# Patient Record
Sex: Male | Born: 1955 | Race: White | Hispanic: No | Marital: Married | Smoking: Never smoker
Health system: Southern US, Community
[De-identification: ages and names within clinical notes are randomized; demographics above are authoritative.]

## PROBLEM LIST (undated history)

## (undated) DIAGNOSIS — M1990 Unspecified osteoarthritis, unspecified site: Secondary | ICD-10-CM

## (undated) DIAGNOSIS — Z8619 Personal history of other infectious and parasitic diseases: Secondary | ICD-10-CM

## (undated) DIAGNOSIS — Z9889 Other specified postprocedural states: Secondary | ICD-10-CM

## (undated) DIAGNOSIS — N200 Calculus of kidney: Secondary | ICD-10-CM

## (undated) DIAGNOSIS — K4020 Bilateral inguinal hernia, without obstruction or gangrene, not specified as recurrent: Secondary | ICD-10-CM

## (undated) DIAGNOSIS — Z85828 Personal history of other malignant neoplasm of skin: Secondary | ICD-10-CM

## (undated) DIAGNOSIS — Z87442 Personal history of urinary calculi: Secondary | ICD-10-CM

## (undated) DIAGNOSIS — C801 Malignant (primary) neoplasm, unspecified: Secondary | ICD-10-CM

## (undated) HISTORY — DX: Calculus of kidney: N20.0

## (undated) HISTORY — DX: Personal history of other infectious and parasitic diseases: Z86.19

## (undated) HISTORY — PX: TONSILLECTOMY: SHX5217

## (undated) HISTORY — DX: Personal history of other malignant neoplasm of skin: Z85.828

## (undated) HISTORY — DX: Unspecified osteoarthritis, unspecified site: M19.90

## (undated) HISTORY — DX: Other specified postprocedural states: Z98.890

---

## 2007-09-14 HISTORY — PX: COLONOSCOPY: SHX174

## 2007-09-14 LAB — HM COLONOSCOPY

## 2011-09-21 LAB — HM HEPATITIS C SCREENING LAB: HM Hepatitis Screen: NEGATIVE

## 2012-09-13 HISTORY — PX: MOHS SURGERY: SUR867

## 2013-08-21 DIAGNOSIS — C44211 Basal cell carcinoma of skin of unspecified ear and external auricular canal: Secondary | ICD-10-CM | POA: Insufficient documentation

## 2013-08-21 DIAGNOSIS — Z9889 Other specified postprocedural states: Secondary | ICD-10-CM

## 2013-08-21 DIAGNOSIS — Z85828 Personal history of other malignant neoplasm of skin: Secondary | ICD-10-CM

## 2013-08-21 DIAGNOSIS — C44212 Basal cell carcinoma of skin of right ear and external auricular canal: Secondary | ICD-10-CM | POA: Insufficient documentation

## 2013-08-21 HISTORY — DX: Personal history of other malignant neoplasm of skin: Z85.828

## 2013-08-21 HISTORY — DX: Personal history of other malignant neoplasm of skin: Z98.890

## 2014-04-23 ENCOUNTER — Ambulatory Visit: Payer: Self-pay | Admitting: Family Medicine

## 2014-04-23 LAB — TSH: TSH: 4.05 u[IU]/mL (ref 0.41–5.90)

## 2014-04-23 LAB — LIPID PANEL
CHOLESTEROL: 157 mg/dL (ref 0–200)
HDL: 64 mg/dL (ref 35–70)
LDL Cholesterol: 80 mg/dL
Triglycerides: 63 mg/dL (ref 40–160)

## 2014-04-23 LAB — BASIC METABOLIC PANEL
BUN: 11 mg/dL (ref 4–21)
CREATININE: 1.1 mg/dL (ref 0.6–1.3)
Glucose: 95 mg/dL
POTASSIUM: 4.3 mmol/L (ref 3.4–5.3)
SODIUM: 137 mmol/L (ref 137–147)

## 2014-04-23 LAB — PSA: PSA: 2.3

## 2015-05-09 DIAGNOSIS — M1990 Unspecified osteoarthritis, unspecified site: Secondary | ICD-10-CM | POA: Insufficient documentation

## 2015-05-09 DIAGNOSIS — Z87442 Personal history of urinary calculi: Secondary | ICD-10-CM | POA: Insufficient documentation

## 2015-05-09 DIAGNOSIS — R319 Hematuria, unspecified: Secondary | ICD-10-CM | POA: Insufficient documentation

## 2015-05-09 DIAGNOSIS — N529 Male erectile dysfunction, unspecified: Secondary | ICD-10-CM | POA: Insufficient documentation

## 2015-05-12 ENCOUNTER — Encounter: Payer: Self-pay | Admitting: Family Medicine

## 2015-05-12 ENCOUNTER — Ambulatory Visit (INDEPENDENT_AMBULATORY_CARE_PROVIDER_SITE_OTHER): Payer: BC Managed Care – PPO | Admitting: Family Medicine

## 2015-05-12 VITALS — BP 98/60 | HR 63 | Temp 99.0°F | Resp 16 | Ht 76.25 in | Wt 186.6 lb

## 2015-05-12 DIAGNOSIS — Z125 Encounter for screening for malignant neoplasm of prostate: Secondary | ICD-10-CM | POA: Diagnosis not present

## 2015-05-12 DIAGNOSIS — E291 Testicular hypofunction: Secondary | ICD-10-CM | POA: Insufficient documentation

## 2015-05-12 DIAGNOSIS — N529 Male erectile dysfunction, unspecified: Secondary | ICD-10-CM | POA: Diagnosis not present

## 2015-05-12 DIAGNOSIS — Z0000 Encounter for general adult medical examination without abnormal findings: Secondary | ICD-10-CM

## 2015-05-12 DIAGNOSIS — Z23 Encounter for immunization: Secondary | ICD-10-CM | POA: Diagnosis not present

## 2015-05-12 LAB — IFOBT (OCCULT BLOOD): IMMUNOLOGICAL FECAL OCCULT BLOOD TEST: NEGATIVE

## 2015-05-12 NOTE — Progress Notes (Signed)
Patient: Brian Duncan, Male    DOB: [birth year], 59 y.o.   MRN: [id number] Visit Date: 05/12/2015  Today's Provider: Lelon Huh, MD   Chief Complaint  Patient presents with  . Annual Exam  . Erectile Dysfunction  . Arthritis   Subjective:    Annual physical exam Brian Duncan is a 59 y.o. male who presents today for health maintenance and complete physical. He feels well. He reports exercising yes, yard work. He reports he is sleeping fairly well/6-7 hours.  -----------------------------------------------------------------   Erectile Dysfunction from  04/23/2014; started Viagra 100 mg 1/2-1 tablet qd prn.He states that he took 1/2 of Viagra sample which which worked very will, but it did give him headache while lasted several hours. He was also noted last her to have low free testosterone level, but normal total testosterone.     Review of Systems  Constitutional: Negative.  Negative for fever, chills, appetite change and fatigue.  HENT: Positive for tinnitus. Negative for congestion, ear pain, hearing loss, nosebleeds and trouble swallowing.   Eyes: Positive for photophobia and visual disturbance. Negative for pain.  Respiratory: Negative.  Negative for cough, chest tightness and shortness of breath.   Cardiovascular: Negative.  Negative for chest pain, palpitations and leg swelling.  Gastrointestinal: Negative.  Negative for nausea, vomiting, abdominal pain, diarrhea, constipation and blood in stool.  Endocrine: Negative for polydipsia, polyphagia and polyuria.       Poor libido  Genitourinary: Positive for hematuria. Negative for dysuria, urgency, flank pain and scrotal swelling.  Musculoskeletal: Positive for myalgias. Negative for back pain, joint swelling, arthralgias and neck stiffness.  Skin: Negative.  Negative for color change, rash and wound.  Allergic/Immunologic: Negative.   Neurological: Positive for light-headedness. Negative for dizziness, tremors,  seizures, speech difficulty, weakness and headaches.  Hematological: Negative.   Psychiatric/Behavioral: Negative.  Negative for behavioral problems, confusion, sleep disturbance, dysphoric mood and decreased concentration. The patient is not nervous/anxious.     Social History He  reports that he has never smoked. He does not have any smokeless tobacco history on file. He reports that he does not drink alcohol or use illicit drugs. Social History   Social History  . Marital Status: Married    Spouse Name: N/A  . Number of Children: 0  . Years of Education: N/A   Occupational History  . Retired     Former Chief Financial Officer for [employer]  . Smoking status: Never Smoker   . Smokeless tobacco: None  . Alcohol Use: No  . Drug Use: No  . Sexual Activity: Not Asked   Other Topics Concern  . None   Social History Narrative    Patient Active Problem List   Diagnosis Date Noted  . Arthritis 05/09/2015  . 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 Hematuria 05/09/2015  . Personal history of kidney stones 05/09/2015  . Basal cell carcinoma of ear 08/21/2013    Past Surgical History  Procedure Laterality Date  . Tonsillectomy    . Colonoscopy  2009    per pt report was advised to repeat in 10 years    Family History  Family Status  Relation Status Death Age  . Mother Alive   . Father Alive   . Sister Alive   . Brother Deceased 73   His family history includes Diabetes in his mother; Heart disease in his brother, father, and mother; Prostate cancer in his father; Uterine  cancer in his sister.    No Known Allergies  Previous Medications   ASPIRIN 81 MG TABLET    Take 81 mg by mouth daily.   MULTIPLE VITAMINS-MINERALS (MULTIVITAMIN PO)    Take 1 capsule by mouth daily.   SILDENAFIL (VIAGRA) 100 MG TABLET    Take 0.5-1 tablets by mouth. 30 minutes before sexual activity, no more than one in a day   VITAMIN E 400 UNIT CAPSULE    Take 400  Units by mouth daily.    Patient Care Team: Birdie Sons, MD as PCP - General (Family Medicine)     Objective:   Vitals: BP 98/60 mmHg  Pulse 63  Temp(Src) 99 F (37.2 C) (Oral)  Resp 16  Ht 6' 4.25" (1.937 m)  Wt 186 lb 9.6 oz (84.641 kg)  BMI 22.56 kg/m2  SpO2 97%   Physical Exam   General Appearance:    Alert, cooperative, no distress, appears stated age  Head:    Normocephalic, without obvious abnormality, atraumatic  Eyes:    PERRL, conjunctiva/corneas clear, EOM's intact, fundi    benign, both eyes       Ears:    Normal TM's and external ear canals, both ears  Nose:   Nares normal, septum midline, mucosa normal, no drainage   or sinus tenderness  Throat:   Lips, mucosa, and tongue normal; teeth and gums normal  Neck:   Supple, symmetrical, trachea midline, no adenopathy;       thyroid:  No enlargement/tenderness/nodules; no carotid   bruit or JVD  Back:     Symmetric, no curvature, ROM normal, no CVA tenderness  Lungs:     Clear to auscultation bilaterally, respirations unlabored  Chest wall:    No tenderness or deformity  Heart:    Regular rate and rhythm, S1 and S2 normal, no murmur, rub   or gallop  Abdomen:     Soft, non-tender, bowel sounds active all four quadrants,    no masses, no organomegaly  Genitalia:    deferred  Rectal:    the prostate is enlarged at the bilateral, with an approx volume of 30 gms, negative bulge  Extremities:   Extremities normal, atraumatic, no cyanosis or edema  Pulses:   2+ and symmetric all extremities  Skin:   Skin color, texture, turgor normal, no rashes or lesions  Lymph nodes:   Cervical, supraclavicular, and axillary nodes normal  Neurologic:   CNII-XII intact. Normal strength, sensation and reflexes      throughout    Results for orders placed or performed in visit on 05/12/15  IFOBT POC (occult bld, rslt in office)  Result Value Ref Range   IFOBT Negative     Depression Screen PHQ 2/9 Scores 05/12/2015  PHQ -  2 Score 0  PHQ- 9 Score 1      Assessment & Plan:     Routine Health Maintenance and Physical Exam  Exercise Activities and Dietary recommendations Goals    None      Immunization History  Administered Date(s) Administered  . Tdap 03/12/2010    Health Maintenance  Topic Date Due  . Hepatitis C Screening  [birth year]  . HIV Screening  08/27/1973  . COLONOSCOPY  08/27/2008  . INFLUENZA VACCINE  04/14/2015  . TETANUS/TDAP  03/12/2020      Discussed health benefits of physical activity, and encouraged him to engage in regular exercise appropriate for his age and condition.    -------------------------------------------------------------------- 1. Annual physical exam  -  Basic metabolic panel - IFOBT POC (occult bld, rslt in office)  2. Erectile dysfunction, unspecified erectile dysfunction type Viagra caused headache, Discussed trying other ED meds. Will see how labs look first.  - TSH  3. Prostate cancer screening  - PSA  4. Hypogonadism in male  - Testosterone,Free and Total  5. Need for influenza vaccination  - Flu Vaccine QUAD 36+ mos IM

## 2015-05-13 LAB — BASIC METABOLIC PANEL
BUN / CREAT RATIO: 9 (ref 9–20)
BUN: 9 mg/dL (ref 6–24)
CHLORIDE: 102 mmol/L (ref 97–108)
CO2: 25 mmol/L (ref 18–29)
Calcium: 9.3 mg/dL (ref 8.7–10.2)
Creatinine, Ser: 1.01 mg/dL (ref 0.76–1.27)
GFR calc non Af Amer: 83 mL/min/{1.73_m2} (ref >59)
GFR, EST AFRICAN AMERICAN: 96 mL/min/{1.73_m2} (ref >59)
Glucose: 95 mg/dL (ref 65–99)
Potassium: 4.8 mmol/L (ref 3.5–5.2)
Sodium: 139 mmol/L (ref 134–144)

## 2015-05-13 LAB — TESTOSTERONE,FREE AND TOTAL
TESTOSTERONE FREE: 5.8 pg/mL — AB (ref 7.2–24.0)
Testosterone: 828 ng/dL (ref 348–1197)

## 2015-05-13 LAB — PSA: Prostate Specific Ag, Serum: 1.4 ng/mL (ref 0.0–4.0)

## 2015-05-13 LAB — TSH: TSH: 2.78 u[IU]/mL (ref 0.450–4.500)

## 2015-05-17 ENCOUNTER — Encounter: Payer: Self-pay | Admitting: Family Medicine

## 2015-06-23 IMAGING — CR DG SHOULDER 3+V*L*
1 series · 3 of 3 positions shown · non-contrast
Comparison: None.

CLINICAL DATA: Atraumatic proximal humeral/ shoulder pain

EXAM:
DG SHOULDER 3+VIEWS LEFT

[Series 1: kdxr shoulder left complete · 0.14mm/px · 3 of 3 slices shown]
[im 1/3]
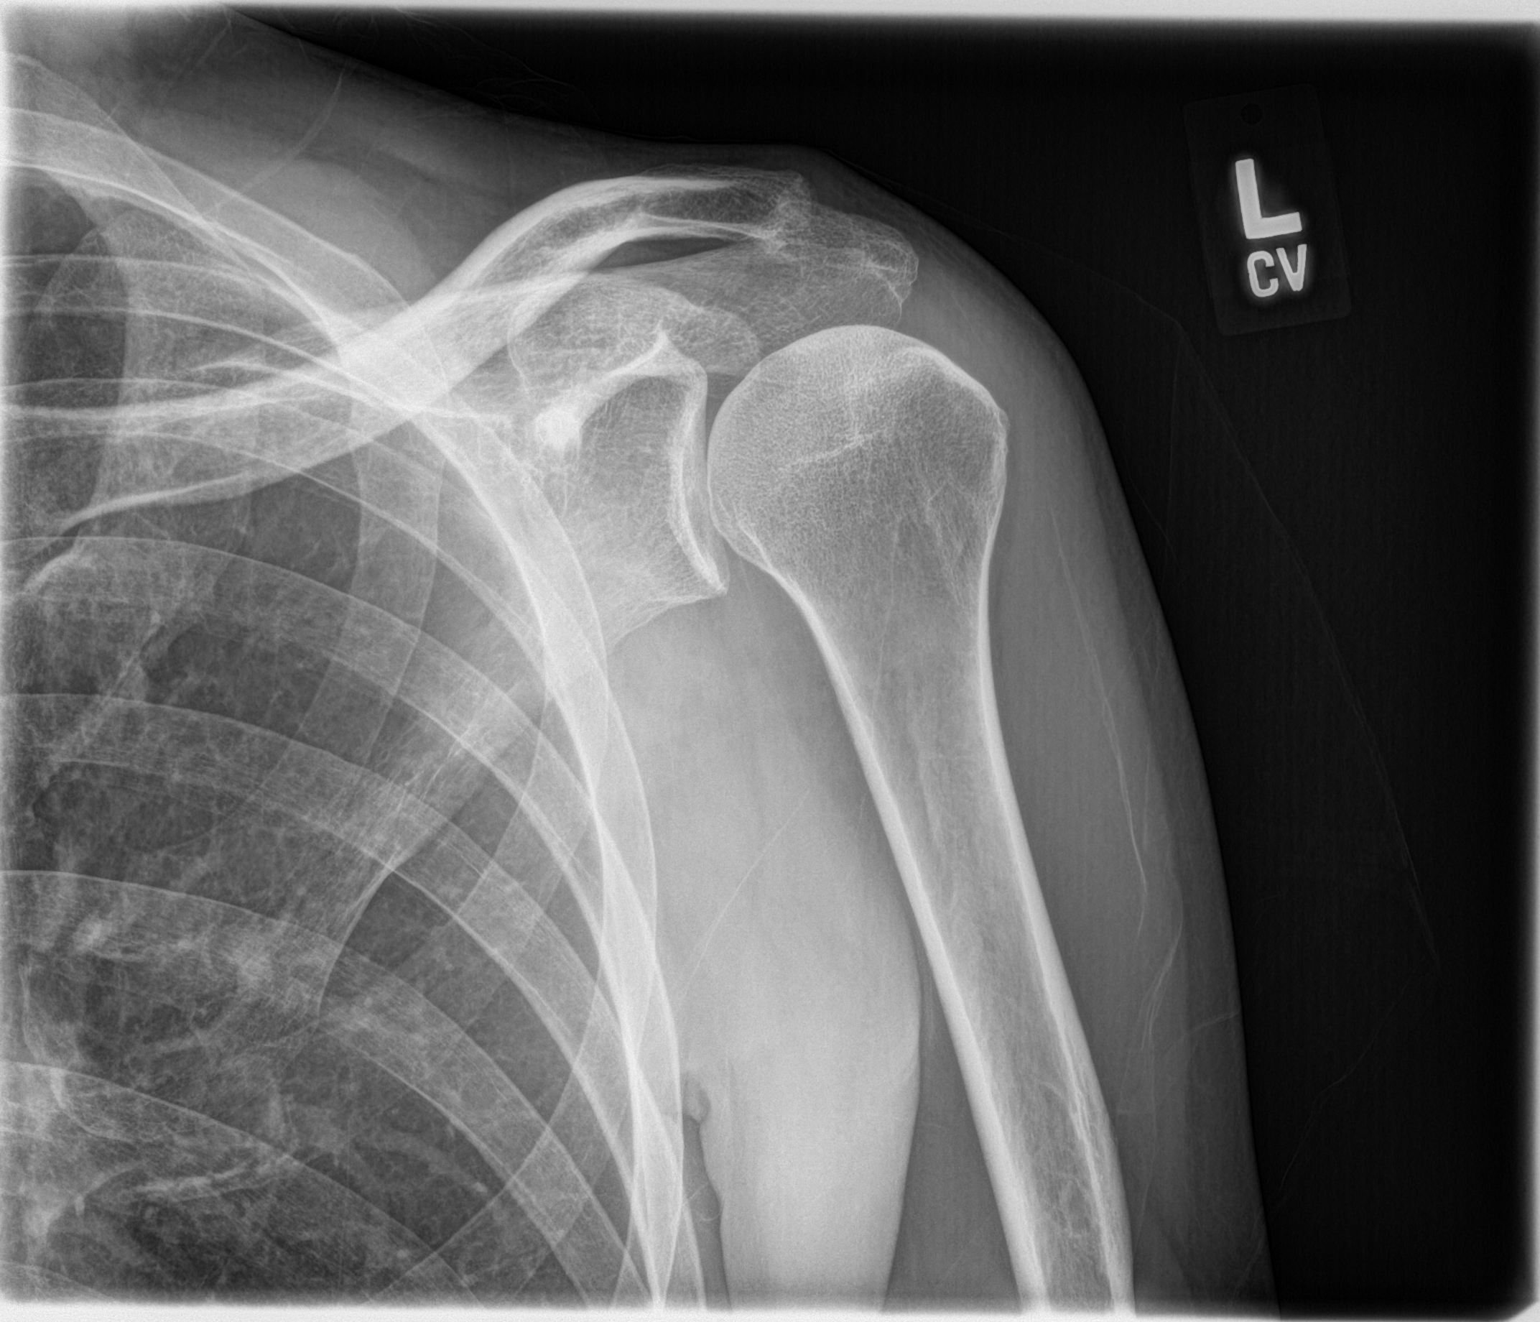
[im 2/3]
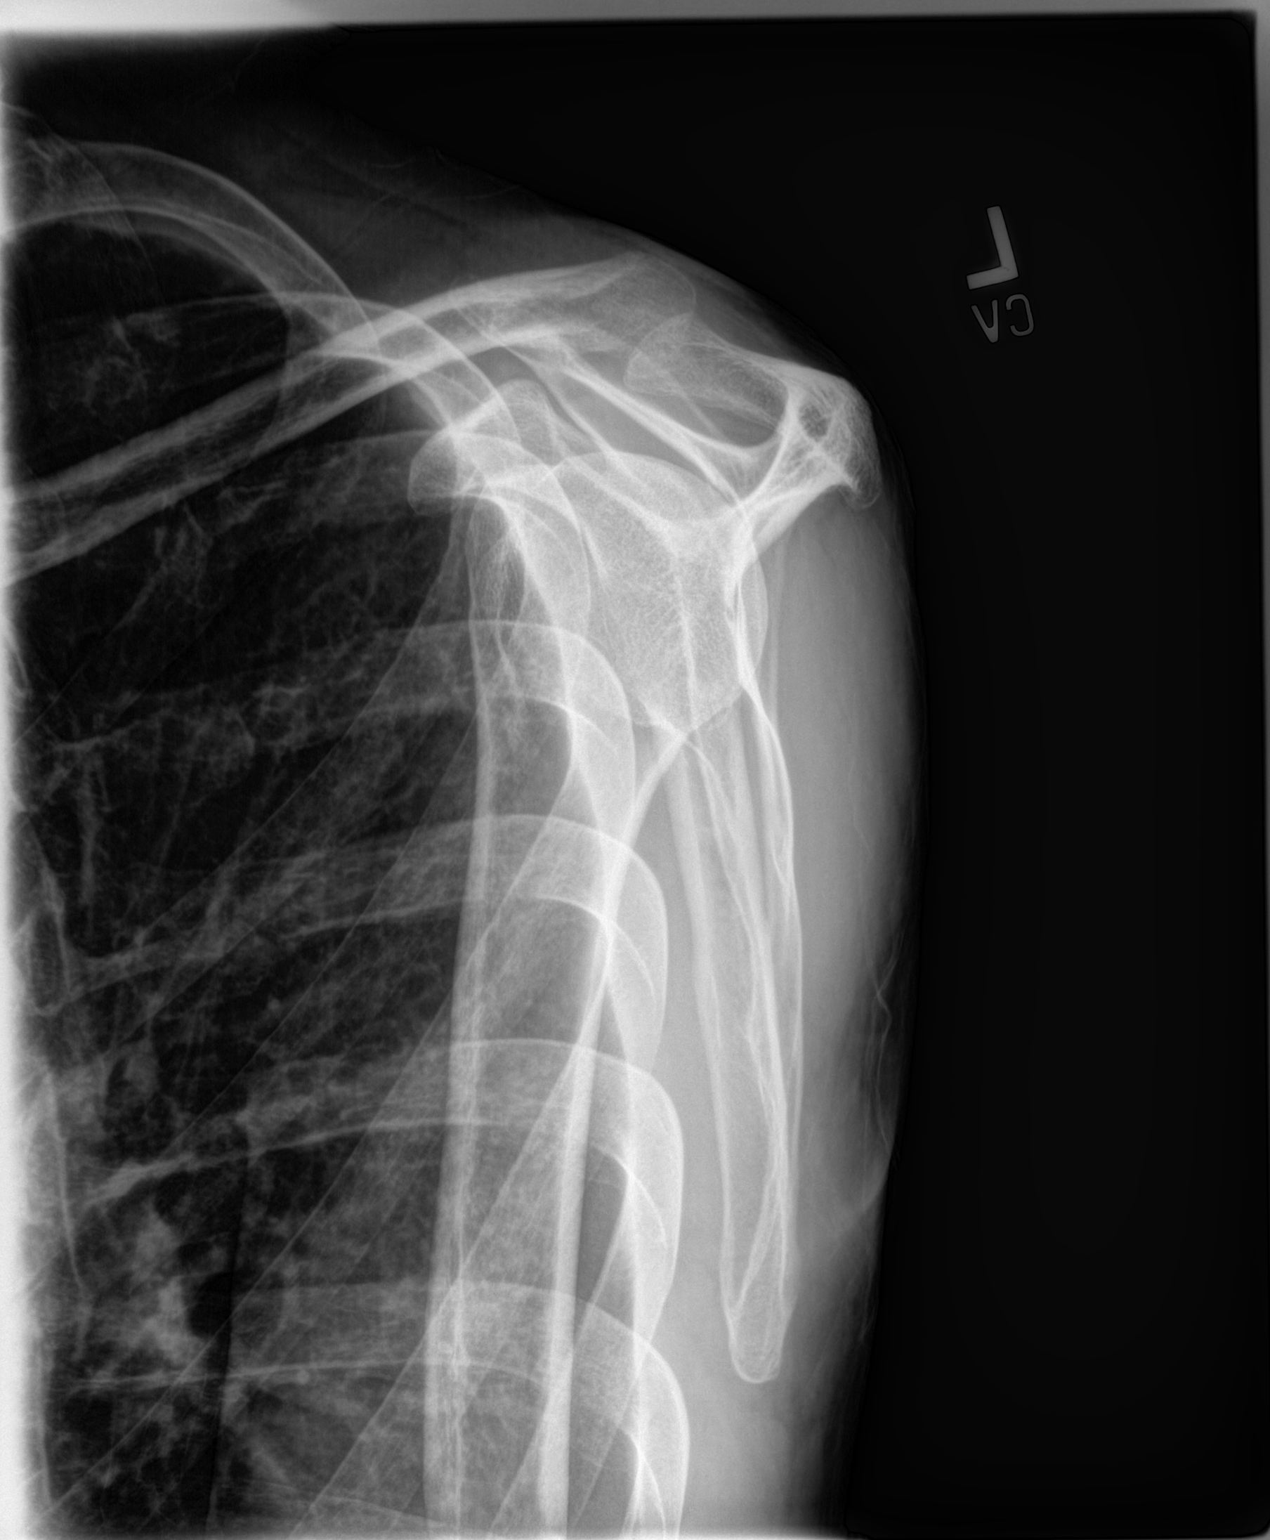
[im 3/3]
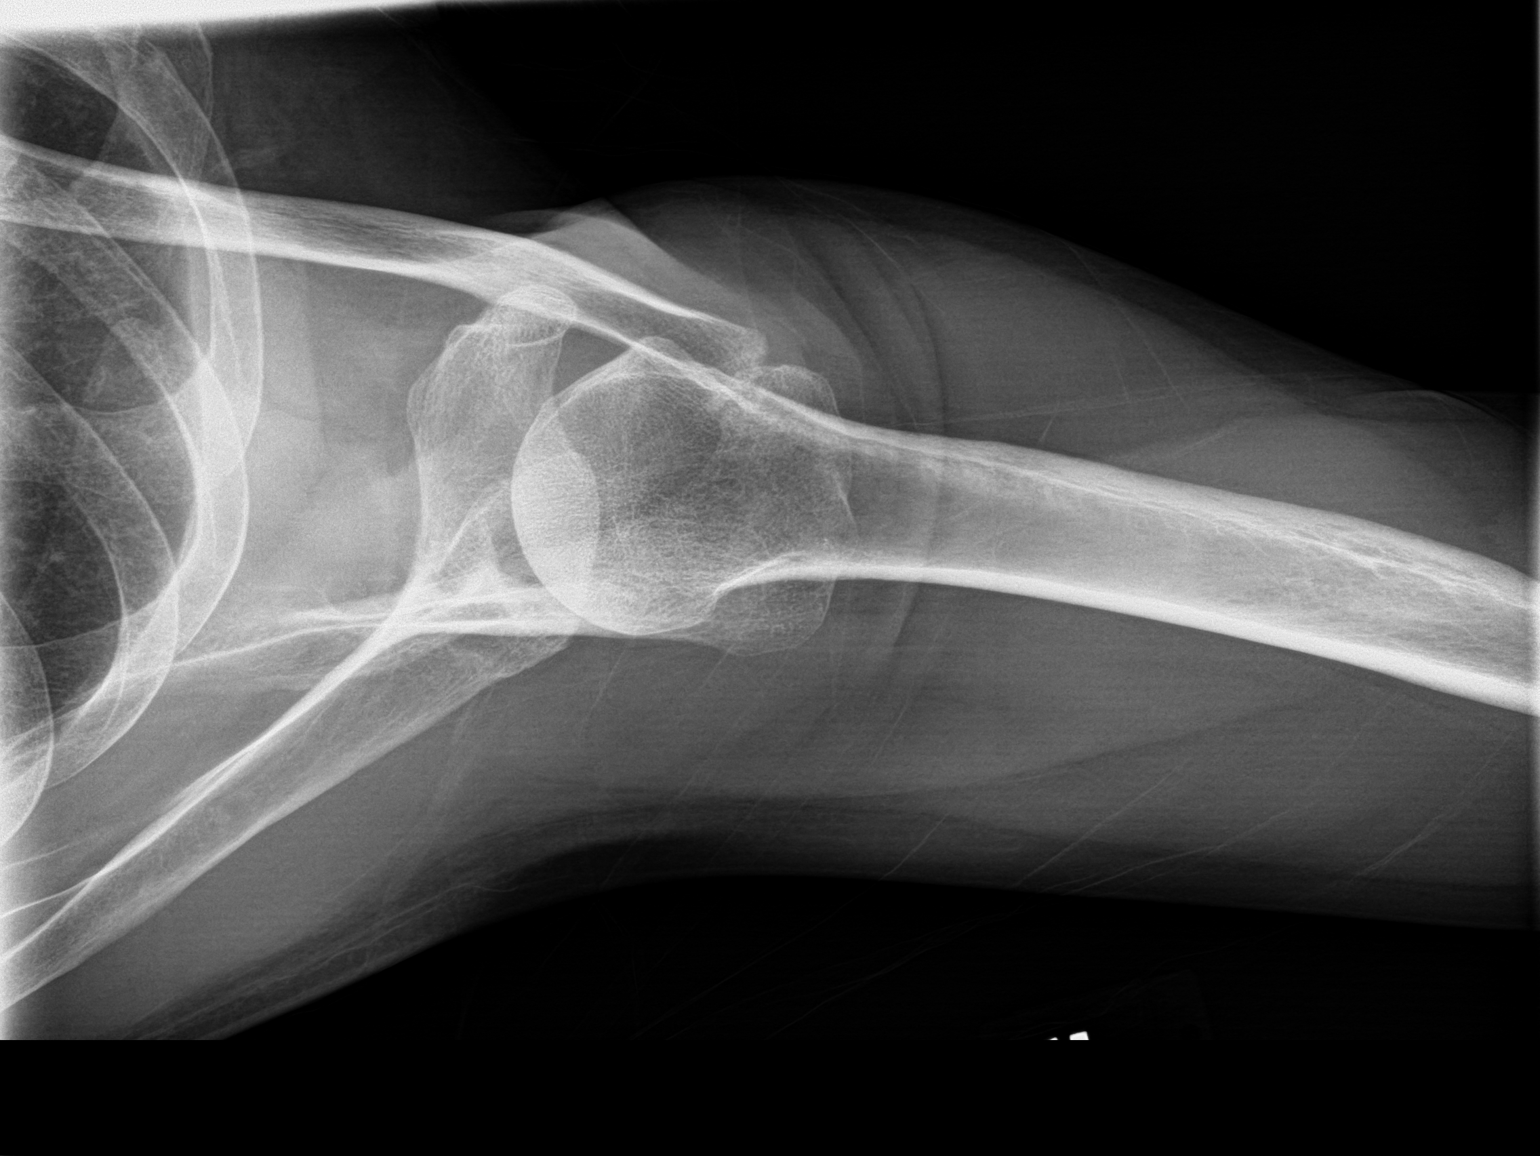

[3 of 3 positions shown; findings below may reference images not displayed]

FINDINGS: The bones are adequately mineralized for age. There is no acute
fracture nor dislocation. There is no significant bony degenerative
change. The observed portions of the left clavicle and upper left
ribs exhibit no acute abnormalities. There is subtle contour
deformity of the anterolateral aspect of the second rib. Likely
reflecting an old injury.
IMPRESSION: There is no acute or significant chronic bony abnormality of the
left shoulder.

## 2016-02-19 ENCOUNTER — Encounter: Payer: Self-pay | Admitting: Family Medicine

## 2016-02-19 ENCOUNTER — Ambulatory Visit (INDEPENDENT_AMBULATORY_CARE_PROVIDER_SITE_OTHER): Payer: BC Managed Care – PPO | Admitting: Family Medicine

## 2016-02-19 VITALS — BP 110/60 | HR 66 | Temp 98.7°F | Resp 16 | Ht 76.0 in | Wt 183.0 lb

## 2016-02-19 DIAGNOSIS — T148 Other injury of unspecified body region: Secondary | ICD-10-CM

## 2016-02-19 DIAGNOSIS — R509 Fever, unspecified: Secondary | ICD-10-CM | POA: Diagnosis not present

## 2016-02-19 DIAGNOSIS — W57XXXA Bitten or stung by nonvenomous insect and other nonvenomous arthropods, initial encounter: Secondary | ICD-10-CM

## 2016-02-19 MED ORDER — DOXYCYCLINE HYCLATE 100 MG PO TABS: 100.0000 mg | ORAL_TABLET | Freq: Two times a day (BID) | ORAL | Status: DC

## 2016-02-19 NOTE — Progress Notes (Signed)
Patient ID: YARIEL AMIE, male   DOB: 1956-04-22, 60 y.o.   MRN: [id number]       Patient: RUSBEL IWEN Male    DOB: July 09, 1956   60 y.o.   MRN: [id number] Visit Date: 02/19/2016  Today's Provider: Lelon Huh, MD   Chief Complaint  Patient presents with  . Fever   Subjective:    HPI Comments: Patient reports tick bite 6 weeks ago. Patient concerned that symptoms may be related to bite.  Fever  This is a new problem. The current episode started yesterday. The maximum temperature noted was 102 to 102.9 F. Associated symptoms include congestion, coughing, nausea and a sore throat. Pertinent negatives include no abdominal pain, diarrhea or vomiting. He has tried acetaminophen for the symptoms. The treatment provided moderate relief.       No Known Allergies Current Meds  Medication Sig  . aspirin 81 MG tablet Take 81 mg by mouth daily.  . Multiple Vitamins-Minerals (MULTIVITAMIN PO) Take 1 capsule by mouth daily.  . sildenafil (VIAGRA) 100 MG tablet Take 0.5-1 tablets by mouth. 30 minutes before sexual activity, no more than one in a day  . vitamin E 400 UNIT capsule Take 400 Units by mouth daily.    Review of Systems  Constitutional: Positive for fever.  HENT: Positive for congestion and sore throat.   Respiratory: Positive for cough.   Gastrointestinal: Positive for nausea. Negative for vomiting, abdominal pain, diarrhea, blood in stool and abdominal distention.    Social History  Substance Use Topics  . Smoking status: Never Smoker   . Smokeless tobacco: Never Used  . Alcohol Use: No   Objective:   BP 110/60 mmHg  Pulse 66  Temp(Src) 98.7 F (37.1 C) (Oral)  Resp 16  Ht 6\' 4"  (1.93 m)  Wt 183 lb (83.008 kg)  BMI 22.28 kg/m2  SpO2 98%  Physical Exam   General Appearance:    Alert, cooperative, no distress  HEENT:   OP/NO pink, clear, no sinus tenderness. No LAD .  Eyes:    PERRL, conjunctiva/corneas clear, EOM's intact       Lungs:     Clear to  auscultation bilaterally, respirations unlabored  Heart:    Regular rate and rhythm, no murmurs.  Neurologic:   Awake, alert, oriented x 3. No apparent focal neurological           defect.   Abd:   Soft, non-tender. Non distended,  No masses       Assessment & Plan:     1. Fever and chills Cover with doxycycline while awaiting lab results.  - B. Burgdorfi Antibodies - CBC - Comprehensive metabolic panel  2. Tick bite  - B. Burgdorfi Antibodies - CBC - Comprehensive metabolic panel       Lelon Huh, MD  [facility]

## 2016-02-20 LAB — CBC
HEMATOCRIT: 43.3 % (ref 37.5–51.0)
HEMOGLOBIN: 13.7 g/dL (ref 12.6–17.7)
MCH: 31.8 pg (ref 26.6–33.0)
MCHC: 31.6 g/dL (ref 31.5–35.7)
MCV: 101 fL — AB (ref 79–97)
Platelets: 192 10*3/uL (ref 150–379)
RBC: 4.31 x10E6/uL (ref 4.14–5.80)
RDW: 13.3 % (ref 12.3–15.4)
WBC: 5.5 10*3/uL (ref 3.4–10.8)

## 2016-02-20 LAB — COMPREHENSIVE METABOLIC PANEL
ALBUMIN: 4.4 g/dL (ref 3.5–5.5)
ALK PHOS: 57 IU/L (ref 39–117)
ALT: 19 IU/L (ref 0–44)
AST: 26 IU/L (ref 0–40)
Albumin/Globulin Ratio: 1.8 (ref 1.2–2.2)
BILIRUBIN TOTAL: 0.3 mg/dL (ref 0.0–1.2)
BUN / CREAT RATIO: 8 — AB (ref 9–20)
BUN: 8 mg/dL (ref 6–24)
CHLORIDE: 100 mmol/L (ref 96–106)
CO2: 28 mmol/L (ref 18–29)
Calcium: 9.6 mg/dL (ref 8.7–10.2)
Creatinine, Ser: 0.96 mg/dL (ref 0.76–1.27)
GFR calc Af Amer: 101 mL/min/{1.73_m2} (ref >59)
GFR calc non Af Amer: 87 mL/min/{1.73_m2} (ref >59)
GLOBULIN, TOTAL: 2.5 g/dL (ref 1.5–4.5)
Glucose: 87 mg/dL (ref 65–99)
Potassium: 4.9 mmol/L (ref 3.5–5.2)
SODIUM: 141 mmol/L (ref 134–144)
Total Protein: 6.9 g/dL (ref 6.0–8.5)

## 2016-02-20 LAB — B. BURGDORFI ANTIBODIES: Lyme IgG/IgM Ab: <0.91 {ISR} (ref 0.00–0.90)

## 2016-05-13 ENCOUNTER — Ambulatory Visit (INDEPENDENT_AMBULATORY_CARE_PROVIDER_SITE_OTHER): Payer: BC Managed Care – PPO | Admitting: Family Medicine

## 2016-05-13 ENCOUNTER — Encounter: Payer: Self-pay | Admitting: Family Medicine

## 2016-05-13 VITALS — BP 108/68 | HR 64 | Temp 98.2°F | Resp 16 | Ht 76.0 in | Wt 182.0 lb

## 2016-05-13 DIAGNOSIS — Z23 Encounter for immunization: Secondary | ICD-10-CM | POA: Diagnosis not present

## 2016-05-13 DIAGNOSIS — Z125 Encounter for screening for malignant neoplasm of prostate: Secondary | ICD-10-CM

## 2016-05-13 DIAGNOSIS — M179 Osteoarthritis of knee, unspecified: Secondary | ICD-10-CM | POA: Diagnosis not present

## 2016-05-13 DIAGNOSIS — Z0000 Encounter for general adult medical examination without abnormal findings: Secondary | ICD-10-CM | POA: Diagnosis not present

## 2016-05-13 DIAGNOSIS — M1712 Unilateral primary osteoarthritis, left knee: Secondary | ICD-10-CM

## 2016-05-13 DIAGNOSIS — M1710 Unilateral primary osteoarthritis, unspecified knee: Secondary | ICD-10-CM | POA: Insufficient documentation

## 2016-05-13 NOTE — Patient Instructions (Signed)
   Please contact your eyecare professional to schedule a routine eye exam  

## 2016-05-13 NOTE — Progress Notes (Signed)
Patient: Brian Duncan, Male    DOB: 08-25-1956, 60 y.o.   MRN: [id number] Visit Date: 05/13/2016  Today's Provider: Lelon Huh, MD   Chief Complaint  Patient presents with  . Annual Exam   Subjective:    Annual physical exam Brian Duncan is a 60 y.o. male who presents today for health maintenance and complete physical. He feels well. He reports exercising daily. He reports he is sleeping well.  05/12/15 CPE  Lab Results  Component Value Date   WBC 5.5 02/19/2016   HCT 43.3 02/19/2016   PLT 192 02/19/2016   GLUCOSE 87 02/19/2016   CHOL 157 04/23/2014   TRIG 63 04/23/2014   HDL 64 04/23/2014   LDLCALC 80 04/23/2014   ALT 19 02/19/2016   AST 26 02/19/2016   NA 141 02/19/2016   K 4.9 02/19/2016   CL 100 02/19/2016   CREATININE 0.96 02/19/2016   BUN 8 02/19/2016   CO2 28 02/19/2016   TSH 2.780 05/12/2015   PSA 2.3 04/23/2014    -----------------------------------------------------------------   Review of Systems  Constitutional: Negative.   HENT: Positive for tinnitus.   Eyes: Negative.   Respiratory: Negative.   Cardiovascular: Negative.   Gastrointestinal: Negative.   Endocrine: Negative.   Genitourinary: Positive for hematuria.  Musculoskeletal: Negative.   Skin: Negative.   Allergic/Immunologic: Negative.   Neurological: Negative.   Hematological: Negative.   Psychiatric/Behavioral: Negative.     Social History      He  reports that he has never smoked. He has never used smokeless tobacco. He reports that he does not drink alcohol or use drugs.       Social History   Social History  . Marital status: Married    Spouse name: N/A  . Number of children: 0  . Years of education: N/A   Occupational History  . Retired     Former Chief Financial Officer for [employer]  . Smoking status: Never Smoker  . Smokeless tobacco: Never Used  . Alcohol use No  . Drug use: No  . Sexual activity: Not Asked   Other Topics Concern    . None   Social History Narrative  . None    Past Medical History:  Diagnosis Date  . History of chicken pox   . Hx of measles   . Kidney stone   . Status post Mohs surgery for basal cell carcinoma 08/21/2013   [facility]     Patient Active Problem List   Diagnosis Date Noted  . Hypogonadism in male 05/12/2015  . Arthritis 05/09/2015  . 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 Hematuria 05/09/2015  . Personal history of kidney stones 05/09/2015  . Basal cell carcinoma of ear 08/21/2013    Past Surgical History:  Procedure Laterality Date  . COLONOSCOPY  2009   per pt report was advised to repeat in 10 years  . TONSILLECTOMY      Family History        Family Status  Relation Status  . Mother Deceased at age 31  . Father Alive  . Sister Alive  . Brother Deceased at age 18        His family history includes Diabetes in his mother; Heart disease in his brother, father, and mother; Prostate cancer in his father; Uterine cancer in his sister.    No Known Allergies  Current Meds  Medication Sig  . aspirin 81 MG  tablet Take 81 mg by mouth daily.  . Multiple Vitamins-Minerals (MULTIVITAMIN PO) Take 1 capsule by mouth daily.  . sildenafil (VIAGRA) 100 MG tablet Take 0.5-1 tablets by mouth. 30 minutes before sexual activity, no more than one in a day  . vitamin E 400 UNIT capsule Take 400 Units by mouth daily.    Patient Care Team: Birdie Sons, MD as PCP - General (Family Medicine)     Objective:   Vitals: BP 108/68 (BP Location: Left Arm, Patient Position: Sitting, Cuff Size: Normal)   Pulse 64   Temp 98.2 F (36.8 C) (Oral)   Resp 16   Ht 6\' 4"  (1.93 m)   Wt 182 lb (82.6 kg)   BMI 22.15 kg/m    Physical Exam  Constitutional: He is oriented to person, place, and time. He appears well-developed and well-nourished.  HENT:  Head: Normocephalic and atraumatic.  Right Ear: External ear normal.  Left Ear: External ear normal.  Nose: Nose  normal.  Mouth/Throat: Oropharynx is clear and moist.  Eyes: Conjunctivae and EOM are normal. Pupils are equal, round, and reactive to light.  Neck: Normal range of motion. Neck supple.  Cardiovascular: Normal rate, regular rhythm, normal heart sounds and intact distal pulses.   Pulmonary/Chest: Effort normal and breath sounds normal.  Abdominal: Soft. Bowel sounds are normal.  Musculoskeletal: Normal range of motion.  Neurological: He is alert and oriented to person, place, and time.  Skin: Skin is warm and dry.  Psychiatric: He has a normal mood and affect. His behavior is normal. Judgment and thought content normal.     Depression Screen PHQ 2/9 Scores 05/13/2016 05/12/2015  PHQ - 2 Score 0 0  PHQ- 9 Score 0 1      Assessment & Plan:     Routine Health Maintenance and Physical Exam  Exercise Activities and Dietary recommendations Goals    None      Immunization History  Administered Date(s) Administered  . Influenza,inj,Quad PF,36+ Mos 05/12/2015  . Tdap 03/12/2010    Health Maintenance  Topic Date Due  . HIV Screening  08/27/1973  . INFLUENZA VACCINE  04/13/2016  . COLONOSCOPY  09/13/2017  . TETANUS/TDAP  03/12/2020  . Hepatitis C Screening  Addressed      Discussed health benefits of physical activity, and encouraged him to engage in regular exercise appropriate for his age and condition.    -------------------------------------------------------------------- 1. Annual physical exam Normal exam. Encouraged regular exercise and healthy eating habits.  - Comprehensive metabolic panel  2. Prostate cancer screening  - PSA  3. Need for influenza vaccination  - Flu Vaccine QUAD 36+ mos IM  4. Osteoarthritis of left knee, unspecified osteoarthritis type Intermittent. Not bothering him at all now.     The entirety of the information documented in the History of Present Illness, Review of Systems and Physical Exam were personally obtained by me. Portions  of this information were initially documented by Lynford Humphrey, CMA and reviewed by me for thoroughness and accuracy.     Lelon Huh, MD  [facility]

## 2016-05-14 LAB — COMPREHENSIVE METABOLIC PANEL
ALBUMIN: 4.3 g/dL (ref 3.5–5.5)
ALT: 18 IU/L (ref 0–44)
AST: 25 IU/L (ref 0–40)
Albumin/Globulin Ratio: 1.9 (ref 1.2–2.2)
Alkaline Phosphatase: 49 IU/L (ref 39–117)
BUN / CREAT RATIO: 9 (ref 9–20)
BUN: 9 mg/dL (ref 6–24)
Bilirubin Total: 0.6 mg/dL (ref 0.0–1.2)
CO2: 27 mmol/L (ref 18–29)
CREATININE: 1.01 mg/dL (ref 0.76–1.27)
Calcium: 9.4 mg/dL (ref 8.7–10.2)
Chloride: 100 mmol/L (ref 96–106)
GFR, EST AFRICAN AMERICAN: 95 mL/min/{1.73_m2} (ref >59)
GFR, EST NON AFRICAN AMERICAN: 82 mL/min/{1.73_m2} (ref >59)
GLOBULIN, TOTAL: 2.3 g/dL (ref 1.5–4.5)
GLUCOSE: 84 mg/dL (ref 65–99)
Potassium: 4.1 mmol/L (ref 3.5–5.2)
Sodium: 141 mmol/L (ref 134–144)
TOTAL PROTEIN: 6.6 g/dL (ref 6.0–8.5)

## 2016-05-14 LAB — PSA: Prostate Specific Ag, Serum: 1.3 ng/mL (ref 0.0–4.0)

## 2017-05-19 ENCOUNTER — Ambulatory Visit (INDEPENDENT_AMBULATORY_CARE_PROVIDER_SITE_OTHER): Payer: BC Managed Care – PPO | Admitting: Family Medicine

## 2017-05-19 ENCOUNTER — Encounter: Payer: Self-pay | Admitting: Family Medicine

## 2017-05-19 VITALS — BP 102/60 | HR 62 | Temp 98.6°F | Resp 16 | Ht 76.0 in | Wt 184.0 lb

## 2017-05-19 DIAGNOSIS — Z23 Encounter for immunization: Secondary | ICD-10-CM

## 2017-05-19 DIAGNOSIS — Z0000 Encounter for general adult medical examination without abnormal findings: Secondary | ICD-10-CM

## 2017-05-19 LAB — COMPREHENSIVE METABOLIC PANEL
AG Ratio: 2 (calc) (ref 1.0–2.5)
ALT: 17 U/L (ref 9–46)
AST: 24 U/L (ref 10–35)
Albumin: 4.5 g/dL (ref 3.6–5.1)
Alkaline phosphatase (APISO): 44 U/L (ref 40–115)
BUN: 9 mg/dL (ref 7–25)
CO2: 28 mmol/L (ref 20–32)
CREATININE: 0.93 mg/dL (ref 0.70–1.33)
Calcium: 9.4 mg/dL (ref 8.6–10.3)
Chloride: 103 mmol/L (ref 98–110)
GLUCOSE: 82 mg/dL (ref 65–99)
Globulin: 2.2 g/dL (calc) (ref 1.9–3.7)
Potassium: 4.4 mmol/L (ref 3.5–5.3)
Sodium: 138 mmol/L (ref 135–146)
Total Bilirubin: 0.8 mg/dL (ref 0.2–1.2)
Total Protein: 6.7 g/dL (ref 6.1–8.1)

## 2017-05-19 LAB — LIPID PANEL
CHOL/HDL RATIO: 2.6 (calc) (ref <5.0)
Cholesterol: 177 mg/dL (ref <200)
HDL: 69 mg/dL (ref >40)
LDL Cholesterol (Calc): 91 mg/dL (calc)
NON-HDL CHOLESTEROL (CALC): 108 mg/dL (ref <130)
TRIGLYCERIDES: 79 mg/dL (ref <150)

## 2017-05-19 LAB — PSA: PSA: 1.3 ng/mL (ref <=4.0)

## 2017-05-19 NOTE — Progress Notes (Signed)
Patient: Brian Duncan, Male    DOB: 1956-08-01, 61 y.o.   MRN: [id number] Visit Date: 05/19/2017  Today's Provider: Lelon Huh, MD   Chief Complaint  Patient presents with  . Annual Exam   Subjective:    Annual physical exam Brian Duncan is a 61 y.o. male who presents today for health maintenance and complete physical. He feels well. He reports exercising 2-3 times a week. He reports he is sleeping fairly well.  -----------------------------------------------------------------   Review of Systems  Constitutional: Negative for appetite change, chills, fatigue and fever.  HENT: Positive for tinnitus. Negative for congestion, ear pain, hearing loss, nosebleeds and trouble swallowing.   Eyes: Positive for visual disturbance. Negative for pain.  Respiratory: Negative for cough, chest tightness and shortness of breath.   Cardiovascular: Negative for chest pain, palpitations and leg swelling.  Gastrointestinal: Negative for abdominal pain, blood in stool, constipation, diarrhea, nausea and vomiting.  Endocrine: Negative for polydipsia, polyphagia and polyuria.  Genitourinary: Negative for dysuria and flank pain.  Musculoskeletal: Negative for arthralgias, back pain, joint swelling, myalgias and neck stiffness.  Skin: Negative for color change, rash and wound.       Skin tags on the right side of his neck  Neurological: Negative for dizziness, tremors, seizures, speech difficulty, weakness, light-headedness and headaches.  Psychiatric/Behavioral: Negative for behavioral problems, confusion, decreased concentration, dysphoric mood and sleep disturbance. The patient is not nervous/anxious.   All other systems reviewed and are negative.   Social History      He  reports that he has never smoked. He has never used smokeless tobacco. He reports that he does not drink alcohol or use drugs.       Social History   Social History  . Marital status: Married    Spouse name: N/A   . Number of children: 0  . Years of education: N/A   Occupational History  . Retired     Former Chief Financial Officer for [employer]  . Smoking status: Never Smoker  . Smokeless tobacco: Never Used  . Alcohol use No  . Drug use: No  . Sexual activity: Not Asked   Other Topics Concern  . None   Social History Narrative  . None    Past Medical History:  Diagnosis Date  . History of chicken pox   . Hx of measles   . Kidney stone   . Status post Mohs surgery for basal cell carcinoma 08/21/2013   [facility]     Patient Active Problem List   Diagnosis Date Noted  . OA (osteoarthritis) of knee 05/13/2016  . Hypogonadism in male 05/12/2015  . Arthritis 05/09/2015  . 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 Hematuria 05/09/2015  . Personal history of kidney stones 05/09/2015  . Basal cell carcinoma of ear 08/21/2013    Past Surgical History:  Procedure Laterality Date  . COLONOSCOPY  2009   per pt report was advised to repeat in 10 years  . MOHS SURGERY  2014   Right Ear  . TONSILLECTOMY      Family History        Family Status  Relation Status  . Mother Deceased at age 9  . Father Alive  . Sister Alive  . Brother Deceased at age 35        His family history includes Diabetes in his mother; Heart disease in his brother, father, and mother; Prostate cancer in  his father; Uterine cancer in his sister.     No Known Allergies   Current Outpatient Prescriptions:  .  aspirin 81 MG tablet, Take 81 mg by mouth daily., Disp: , Rfl:  .  Multiple Vitamins-Minerals (MULTIVITAMIN PO), Take 1 capsule by mouth daily., Disp: , Rfl:  .  vitamin E 400 UNIT capsule, Take 400 Units by mouth daily., Disp: , Rfl:  .  sildenafil (VIAGRA) 100 MG tablet, Take 0.5-1 tablets by mouth. 30 minutes before sexual activity, no more than one in a day, Disp: , Rfl:    Patient Care Team: Birdie Sons, MD as PCP - General (Family Medicine) Dasher, Rayvon Char, MD  (Dermatology)      Objective:   Vitals: BP 102/60 (BP Location: Left Arm, Patient Position: Sitting, Cuff Size: Normal)   Pulse 62   Temp 98.6 F (37 C) (Oral)   Resp 16   Ht 6\' 4"  (1.93 m)   Wt 184 lb (83.5 kg)   SpO2 99% Comment: room air  BMI 22.40 kg/m   There were no vitals filed for this visit.   Physical Exam   General Appearance:    Alert, cooperative, no distress, appears stated age  Head:    Normocephalic, without obvious abnormality, atraumatic  Eyes:    PERRL, conjunctiva/corneas clear, EOM's intact, fundi    benign, both eyes       Ears:    Normal TM's and external ear canals, both ears  Nose:   Nares normal, septum midline, mucosa normal, no drainage   or sinus tenderness  Throat:   Lips, mucosa, and tongue normal; teeth and gums normal  Neck:   Supple, symmetrical, trachea midline, no adenopathy;       thyroid:  No enlargement/tenderness/nodules; no carotid   bruit or JVD  Back:     Symmetric, no curvature, ROM normal, no CVA tenderness  Lungs:     Clear to auscultation bilaterally, respirations unlabored  Chest wall:    No tenderness or deformity  Heart:    Regular rate and rhythm, S1 and S2 normal, no murmur, rub   or gallop  Abdomen:     Soft, non-tender, bowel sounds active all four quadrants,    no masses, no organomegaly  Genitalia:    deferred  Rectal:    deferred  Extremities:   Extremities normal, atraumatic, no cyanosis or edema  Pulses:   2+ and symmetric all extremities  Skin:   Skin color, texture, turgor normal, no rashes or lesions  Lymph nodes:   Cervical, supraclavicular, and axillary nodes normal  Neurologic:   CNII-XII intact. Normal strength, sensation and reflexes      throughout    Depression Screen PHQ 2/9 Scores 05/19/2017 05/13/2016 05/12/2015  PHQ - 2 Score 0 0 0  PHQ- 9 Score 1 0 1      Assessment & Plan:     Routine Health Maintenance and Physical Exam  Exercise Activities and Dietary recommendations Goals    None        Immunization History  Administered Date(s) Administered  . Influenza,inj,Quad PF,6+ Mos 05/12/2015, 05/13/2016  . Tdap 03/12/2010    Health Maintenance  Topic Date Due  . HIV Screening  08/27/1973  . INFLUENZA VACCINE  04/13/2017  . COLONOSCOPY  09/13/2017  . TETANUS/TDAP  03/12/2020  . Hepatitis C Screening  Addressed     Discussed health benefits of physical activity, and encouraged him to engage in regular exercise appropriate for his age and  condition.    --------------------------------------------------------------------  1. Annual physical exam Normal exam. Doing well. Check maintenance labs.  - Comprehensive metabolic panel - Lipid panel - PSA  2. Need for shingles vaccine  - Varicella-zoster vaccine IM  3. Need for influenza vaccination  - Flu Vaccine QUAD 36+ mos IM  Return in about 2 months (around 07/19/2017) for Shingrix #2.   Lelon Huh, MD  [facility]

## 2017-07-25 ENCOUNTER — Ambulatory Visit: Payer: BC Managed Care – PPO

## 2017-07-25 DIAGNOSIS — Z23 Encounter for immunization: Secondary | ICD-10-CM

## 2017-07-25 NOTE — Progress Notes (Unsigned)
Administered 2nd shingles vaccine. Patient tolerated well and was observed in the office for 61mins.

## 2018-04-03 ENCOUNTER — Encounter: Payer: Self-pay | Admitting: Family Medicine

## 2018-04-11 ENCOUNTER — Encounter (HOSPITAL_COMMUNITY): Payer: Self-pay | Admitting: Emergency Medicine

## 2018-04-11 ENCOUNTER — Emergency Department (HOSPITAL_COMMUNITY)
Admission: EM | Admit: 2018-04-11 | Discharge: 2018-04-11 | Disposition: A | Discharge status: Home / Self Care | Payer: BC Managed Care – PPO | Attending: Emergency Medicine | Admitting: Emergency Medicine

## 2018-04-11 ENCOUNTER — Other Ambulatory Visit: Payer: Self-pay

## 2018-04-11 ENCOUNTER — Emergency Department (HOSPITAL_COMMUNITY): Payer: BC Managed Care – PPO

## 2018-04-11 DIAGNOSIS — Z79899 Other long term (current) drug therapy: Secondary | ICD-10-CM | POA: Insufficient documentation

## 2018-04-11 DIAGNOSIS — Z7982 Long term (current) use of aspirin: Secondary | ICD-10-CM | POA: Insufficient documentation

## 2018-04-11 DIAGNOSIS — N200 Calculus of kidney: Secondary | ICD-10-CM | POA: Insufficient documentation

## 2018-04-11 DIAGNOSIS — Z85828 Personal history of other malignant neoplasm of skin: Secondary | ICD-10-CM | POA: Insufficient documentation

## 2018-04-11 DIAGNOSIS — R1032 Left lower quadrant pain: Secondary | ICD-10-CM | POA: Diagnosis present

## 2018-04-11 LAB — COMPREHENSIVE METABOLIC PANEL
ALT: 19 U/L (ref 0–44)
AST: 30 U/L (ref 15–41)
Albumin: 4.4 g/dL (ref 3.5–5.0)
Alkaline Phosphatase: 42 U/L (ref 38–126)
Anion gap: 9 (ref 5–15)
BILIRUBIN TOTAL: 0.8 mg/dL (ref 0.3–1.2)
BUN: 13 mg/dL (ref 6–20)
CALCIUM: 9.1 mg/dL (ref 8.9–10.3)
CO2: 27 mmol/L (ref 22–32)
CREATININE: 1 mg/dL (ref 0.61–1.24)
Chloride: 103 mmol/L (ref 98–111)
GFR calc Af Amer: >60 mL/min (ref >60)
Glucose, Bld: 128 mg/dL — ABNORMAL HIGH (ref 70–99)
POTASSIUM: 3.9 mmol/L (ref 3.5–5.1)
Sodium: 139 mmol/L (ref 135–145)
TOTAL PROTEIN: 7 g/dL (ref 6.5–8.1)

## 2018-04-11 LAB — CBC WITH DIFFERENTIAL/PLATELET
Basophils Absolute: 0 10*3/uL (ref 0.0–0.1)
Basophils Relative: 0 %
Eosinophils Absolute: 0 10*3/uL (ref 0.0–0.7)
Eosinophils Relative: 0 %
HCT: 42.5 % (ref 39.0–52.0)
Hemoglobin: 14 g/dL (ref 13.0–17.0)
Lymphocytes Relative: 10 %
Lymphs Abs: 1.4 10*3/uL (ref 0.7–4.0)
MCH: 33 pg (ref 26.0–34.0)
MCHC: 32.9 g/dL (ref 30.0–36.0)
MCV: 100.2 fL — ABNORMAL HIGH (ref 78.0–100.0)
Monocytes Absolute: 0.6 10*3/uL (ref 0.1–1.0)
Monocytes Relative: 5 %
Neutro Abs: 12.1 10*3/uL — ABNORMAL HIGH (ref 1.7–7.7)
Neutrophils Relative %: 85 %
Platelets: 212 10*3/uL (ref 150–400)
RBC: 4.24 MIL/uL (ref 4.22–5.81)
RDW: 13.2 % (ref 11.5–15.5)
WBC: 14.1 10*3/uL — ABNORMAL HIGH (ref 4.0–10.5)

## 2018-04-11 LAB — URINALYSIS, ROUTINE W REFLEX MICROSCOPIC
Bacteria, UA: NONE SEEN
Bilirubin Urine: NEGATIVE
GLUCOSE, UA: NEGATIVE mg/dL
Ketones, ur: NEGATIVE mg/dL
Leukocytes, UA: NEGATIVE
Nitrite: NEGATIVE
PH: 6 (ref 5.0–8.0)
Protein, ur: 30 mg/dL — AB
RBC / HPF: >50 RBC/hpf — ABNORMAL HIGH (ref 0–5)
SPECIFIC GRAVITY, URINE: 1.016 (ref 1.005–1.030)

## 2018-04-11 LAB — LIPASE, BLOOD: LIPASE: 33 U/L (ref 11–51)

## 2018-04-11 MED ORDER — ONDANSETRON HCL 4 MG/2ML IJ SOLN
INTRAMUSCULAR | Status: AC
  Administered 2018-04-11: 8 mg via INTRAVENOUS
  Filled 2018-04-11: qty 2

## 2018-04-11 MED ORDER — MORPHINE SULFATE (PF) 4 MG/ML IV SOLN: 4.0000 mg | Freq: Once | INTRAVENOUS | Status: DC | PRN

## 2018-04-11 MED ORDER — ONDANSETRON 8 MG PO TBDP: 8.0000 mg | ORAL_TABLET | Freq: Three times a day (TID) | ORAL | 0 refills | Status: DC | PRN

## 2018-04-11 MED ORDER — HYDROCODONE-ACETAMINOPHEN 10-325 MG PO TABS: 1.0000 | ORAL_TABLET | Freq: Four times a day (QID) | ORAL | 0 refills | Status: DC | PRN

## 2018-04-11 MED ORDER — KETOROLAC TROMETHAMINE 30 MG/ML IJ SOLN
30.0000 mg | Freq: Once | INTRAMUSCULAR | Status: AC
  Administered 2018-04-11: 30 mg via INTRAVENOUS
  Filled 2018-04-11: qty 1

## 2018-04-11 MED ORDER — ONDANSETRON HCL 4 MG/2ML IJ SOLN
8.0000 mg | Freq: Once | INTRAMUSCULAR | Status: AC
  Administered 2018-04-11: 8 mg via INTRAVENOUS
  Filled 2018-04-11: qty 4

## 2018-04-11 NOTE — ED Triage Notes (Signed)
Patient complaining of abdominal pain starting at 1100 today. Denies vomiting and diarrhea.

## 2018-04-11 NOTE — Discharge Instructions (Addendum)
You have a kidney stone obstructing the tube that carries urine from the kidney to the bladder.  This is causing her pain.  You should call the urologist office before 5 PM today, and schedule a follow-up appointment for tomorrow.  If it anytime you develop a fever, or worsening pain, return to the emergency room.  It would be best to return to the emergency room in [place] long if you are able to do this as this is where the urology specialist can see you more easily.

## 2018-04-11 NOTE — ED Provider Notes (Signed)
[facility] EMERGENCY DEPARTMENT Provider Note   CSN: [id number] Arrival date & time: 04/11/18  1336     History   Chief Complaint Chief Complaint  Patient presents with  . Abdominal Pain    HPI Brian Duncan is a 62 y.o. male.  HPI   Patient presents with sudden onset around 49 AM today with left flank pain and suprapubic pain.  He states he has had 6 kidney stones in the past and this feels similar in terms of the left flank pain.  On my exam, he is vomiting due to pain.  He states he was in his normal state of health this morning.  Has never had suprapubic or lower abdominal pain with previous kidney stones.  Has never had any intervention for his kidney since.  Denies sick contacts.  No diarrhea, last bowel movement was 2 days ago.  Up-to-date on colonoscopies.  Never had any intra-abdominal surgery.  Past Medical History:  Diagnosis Date  . History of chicken pox   . Hx of measles   . Kidney stone   . Status post Mohs surgery for basal cell carcinoma 08/21/2013   [facility]    Patient Active Problem List   Diagnosis Date Noted  . OA (osteoarthritis) of knee 05/13/2016  . Hypogonadism in male 05/12/2015  . Arthritis 05/09/2015  . 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 Hematuria 05/09/2015  . Personal history of kidney stones 05/09/2015  . Basal cell carcinoma of ear 08/21/2013    Past Surgical History:  Procedure Laterality Date  . COLONOSCOPY  2009   per pt report was advised to repeat in 10 years  . MOHS SURGERY  2014   Right Ear  . TONSILLECTOMY          Home Medications    Prior to Admission medications   Medication Sig Start Date End Date Taking? Authorizing Provider  aspirin 81 MG tablet Take 81 mg by mouth daily.    [provider]  HYDROcodone-acetaminophen (NORCO) 10-325 MG tablet Take 1 tablet by mouth every 6 (six) hours as needed. 04/11/18   Sela Hilding, MD  Multiple Vitamins-Minerals (MULTIVITAMIN PO) Take 1  capsule by mouth daily.    [provider]  ondansetron (ZOFRAN-ODT) 8 MG disintegrating tablet Take 1 tablet (8 mg total) by mouth every 8 (eight) hours as needed for nausea or vomiting. 04/11/18   Sela Hilding, MD  sildenafil (VIAGRA) 100 MG tablet Take 0.5-1 tablets by mouth. 30 minutes before sexual activity, no more than one in a day 04/23/14   [provider]  vitamin E 400 UNIT capsule Take 400 Units by mouth daily.    [provider]    Family History Family History  Problem Relation Age of Onset  . Diabetes Mother   . Heart disease Mother   . Heart disease Father   . Prostate cancer Father   . Uterine cancer Sister   . Heart disease Brother     Social History Social History   Tobacco Use  . Smoking status: Never Smoker  . Smokeless tobacco: Never Used  Substance Use Topics  . Alcohol use: No    Alcohol/week: 0.0 oz  . Drug use: No     Allergies   Patient has no known allergies.   Review of Systems Review of Systems  Constitutional: Negative for chills and fever.  Respiratory: Negative for chest tightness.   Cardiovascular: Negative for chest pain.  Gastrointestinal: Positive for abdominal pain,  nausea and vomiting. Negative for diarrhea.  Genitourinary: Positive for flank pain. Negative for difficulty urinating, dysuria, hematuria and penile pain.  Musculoskeletal: Positive for back pain.  All other systems reviewed and are negative.    Physical Exam Updated Vital Signs BP (!) 104/53   Pulse 65   Temp 97.9 F (36.6 C) (Oral)   Resp 18   Ht 6\' 4"  (1.93 m)   Wt 79.4 kg (175 lb)   SpO2 100%   BMI 21.30 kg/m   Physical Exam  Constitutional: He is oriented to person, place, and time. He appears well-developed and well-nourished. He appears distressed.  HENT:  Head: Normocephalic and atraumatic.  Eyes: EOM are normal.  Cardiovascular: Normal rate and regular rhythm.  Pulmonary/Chest: Effort normal and breath sounds  normal.  Abdominal: Soft. Normal appearance and bowel sounds are normal. There is no tenderness. There is no CVA tenderness.  Neurological: He is alert and oriented to person, place, and time.  Skin: Skin is warm and dry. Capillary refill takes 2 to 3 seconds.     ED Treatments / Results  Labs (all labs ordered are listed, but only abnormal results are displayed) Labs Reviewed  CBC WITH DIFFERENTIAL/PLATELET - Abnormal; Notable for the following components:      Result Value   WBC 14.1 (*)    MCV 100.2 (*)    Neutro Abs 12.1 (*)    All other components within normal limits  COMPREHENSIVE METABOLIC PANEL - Abnormal; Notable for the following components:   Glucose, Bld 128 (*)    All other components within normal limits  URINALYSIS, ROUTINE W REFLEX MICROSCOPIC - Abnormal; Notable for the following components:   Hgb urine dipstick MODERATE (*)    Protein, ur 30 (*)    RBC / HPF >50 (*)    All other components within normal limits  LIPASE, BLOOD    EKG None  Radiology US Renal  Result Date: 04/11/2018 CLINICAL DATA:  Acute onset left flank pain with emesis EXAM: RENAL / URINARY TRACT ULTRASOUND COMPLETE COMPARISON:  None. FINDINGS: Right Kidney: Length: 10 cm. Echogenicity within normal limits. No mass or hydronephrosis visualized. Left Kidney: Length: 11.8 cm. Cortical echogenicity within normal limits. Moderate hydronephrosis. 1 cm echogenic focus at the left UPJ. Bladder: Appears normal for degree of bladder distention. IMPRESSION: 1. Moderate left hydronephrosis. 1 cm echogenic focus at the left UPJ is suspect for obstructing stone. Electronically Signed   By: Donavan Foil M.D.   On: 04/11/2018 14:44    Procedures Procedures (including critical care time)  Medications Ordered in ED Medications  morphine 4 MG/ML injection 4 mg (has no administration in time range)  ondansetron (ZOFRAN) injection 8 mg (8 mg Intravenous Given 04/11/18 1416)  ketorolac (TORADOL) 30 MG/ML  injection 30 mg (30 mg Intravenous Given 04/11/18 1457)     Initial Impression / Assessment and Plan / ED Course  I have reviewed the triage vital signs and the nursing notes.  Pertinent labs & imaging results that were available during my care of the patient were reviewed by me and considered in my medical decision making (see chart for details).     Patient presents with acute onset of spasm type left flank and lower abdominal pain. Extensive history of nephrolithiasis, feels similar to this to him.   Will get labs, UA, renal ultrasound to assess for obstructing stone.  Zofran, Toradol, morphine for pain.  D/w Dr. Tresa Moore, urology on call, as long as no fever, acute renal  failure, or uncontrolled pain, he can be discharged with follow up with at [facility] tomorrow.   Labs reassuring, will discharge with pain control and follow up tomorrow.   Final Clinical Impressions(s) / ED Diagnoses   Final diagnoses:  Nephrolithiasis    ED Discharge Orders        Ordered    HYDROcodone-acetaminophen (NORCO) 10-325 MG tablet  Every 6 hours PRN     04/11/18 1530    ondansetron (ZOFRAN-ODT) 8 MG disintegrating tablet  Every 8 hours PRN     04/11/18 1530       Sela Hilding, MD 04/11/18 1540    Carmin Muskrat, MD 04/12/18 [phone]

## 2018-04-11 NOTE — ED Notes (Signed)
Pt was informed that we need a urine. Pt states that he can try. Pt was given an option to go to the bathroom or use a urinal. Pt wanted to try a urinal.

## 2018-04-17 ENCOUNTER — Other Ambulatory Visit: Payer: Self-pay | Admitting: Urology

## 2018-04-19 NOTE — H&P (Signed)
CC: I have a history of kidney stones.  HPI: Brian Duncan is a 62 year-old male established patient who is here for F/U due to a history of renal calculi.  Returns today for CT imaging in ongoing evaluation of a previously identified large left UPJ stone with hydronephrosis via ultrasound performed in the emergency department on 7/30. He was evaluated here on 7/31 and was found to be asymptomatic. He's had 6 stone events previously since his 70s and has been able to pass them all on his own without issue. Denies previous stone procedure intervention. Currently asymptomatic and has been so since her ER visit. Denies pain or discomfort. Denies bothersome voiding symptoms: There are no changes in urine stream, bothersome urgency, painful urination, or gross hematuria. He remains afebrile. He has had some mild constipation since his symptoms began.   The patient was last seen 04/12/2018. The patient's stone was on his left side. He did not pass a stone since the last office visit.   The patient has not had any flank pain since they were last seen. The patient denies any progressive voiding symptoms. He has no seen blood in his urine since the last visit. He is not currently having flank pain, back pain, groin pain, nausea, vomiting, fever or chills. He has never had surgical treatment for calculi in the past.     ALLERGIES: None   MEDICATIONS: Aspirin Ec 81 mg tablet, delayed release  Multivitamin  Vitamin E     GU PSH: None   NON-GU PSH: None   GU PMH: Nocturia - 04/12/2018 Ureteral calculus - 04/12/2018 Urinary Frequency - 04/12/2018    NON-GU PMH: Arthritis    FAMILY HISTORY: Blockage of coronary artery of heart - Brother Diabetes - Runs in Family father living - Father heart failure - Mother mother deceased at age 62 - Mother   SOCIAL HISTORY: Marital Status: Married Current Smoking Status: Patient has never smoked.   Tobacco Use Assessment Completed: Used Tobacco in last 30  days? Has never drank.  Does not drink caffeine. Patient's occupation is/was retired.    REVIEW OF SYSTEMS:    GU Review Male:   Patient reports frequent urination and erection problems. Patient denies hard to postpone urination, burning/ pain with urination, get up at night to urinate, leakage of urine, stream starts and stops, trouble starting your stream, have to strain to urinate , and penile pain.  Gastrointestinal (Upper):   Patient denies nausea, vomiting, and indigestion/ heartburn.  Gastrointestinal (Lower):   Patient reports constipation. Patient denies diarrhea.  Constitutional:   Patient denies fever, night sweats, weight loss, and fatigue.  Skin:   Patient denies skin rash/ lesion and itching.  Eyes:   Patient denies blurred vision and double vision.  Ears/ Nose/ Throat:   Patient denies sinus problems and sore throat.  Hematologic/Lymphatic:   Patient denies swollen glands and easy bruising.  Cardiovascular:   Patient denies leg swelling and chest pains.  Respiratory:   Patient denies cough and shortness of breath.  Endocrine:   Patient denies excessive thirst.  Musculoskeletal:   Patient denies back pain and joint pain.  Neurological:   Patient denies headaches and dizziness.  Psychologic:   Patient denies depression and anxiety.   VITAL SIGNS:      04/14/2018 02:42 PM  Weight 175 lb / 79.38 kg  Height 75 in / 190.5 cm  BP 105/69 mmHg  Heart Rate 59 /min  Temperature 97.6 F / 36.4 C  BMI 21.9  kg/m   MULTI-SYSTEM PHYSICAL EXAMINATION:    Constitutional: Well-nourished. No physical deformities. Normally developed. Good grooming.  Neck: Neck symmetrical, not swollen. Normal tracheal position.  Respiratory: No labored breathing, no use of accessory muscles.   Cardiovascular: Normal temperature, normal extremity pulses, no swelling, no varicosities.  Skin: No paleness, no jaundice, no cyanosis. No lesion, no ulcer, no rash.  Neurologic / Psychiatric: Oriented to time,  oriented to place, oriented to person. No depression, no anxiety, no agitation.  Gastrointestinal: No mass, no tenderness, no rigidity, non obese abdomen. No CVAT, no flank or suprapubic tenderness.  Musculoskeletal: Normal gait and station of head and neck.     PAST DATA REVIEWED:  Source Of History:  Patient  Records Review:   Previous Hospital Records, Previous Patient Records  Urine Test Review:   Urinalysis  X-Ray Review: C.T. Stone Protocol: Reviewed Films. Discussed With Patient.     04/14/18  Urinalysis  Urine Appearance Clear   Urine Color Yellow   Urine Glucose Neg mg/dL  Urine Bilirubin Neg mg/dL  Urine Ketones Neg mg/dL  Urine Specific Gravity 1.015   Urine Blood Neg ery/uL  Urine pH 7.5   Urine Protein Neg mg/dL  Urine Urobilinogen 0.2 mg/dL  Urine Nitrites Neg   Urine Leukocyte Esterase Neg leu/uL   PROCEDURES:         C.T. Urogram - [id number]  Punctate appearing non-obstructing RLP stone  A 7.53mm width left renal collecting system stone is seen. 1500 HU. This was previously identified in the left UPJ. Mild dilation of the UPJ is present but this could represent a chronic finding. No ureteral or bladder calculi seen.               Urinalysis Dipstick Dipstick Cont'd  Color: Yellow Bilirubin: Neg mg/dL  Appearance: Clear Ketones: Neg mg/dL  Specific Gravity: 1.015 Blood: Neg ery/uL  pH: 7.5 Protein: Neg mg/dL  Glucose: Neg mg/dL Urobilinogen: 0.2 mg/dL    Nitrites: Neg    Leukocyte Esterase: Neg leu/uL    ASSESSMENT:      ICD-10 Details  1 GU:   Renal calculus - N20.0 Left   PLAN:            Medications New Meds: Tamsulosin Hcl 0.4 mg capsule 1 capsule PO Q HS   #30  3 Refill(s)            Orders Labs Urine Culture          Schedule Return Visit/Planned Activity: 1 Week - Schedule Surgery             Note: ESWL          Document Letter(s):  Created for Patient: Clinical Summary         Notes:   No ureteral calculi identified on CT  imaging today. As previously discussed by Dr. Matilde Sprang, the calculus appears to have ball-valve back into the renal pelvis/lower pole area. Continued observation versus treatment with lithotripsy. Discussed with the patient. He understands repeat procedure may be required if stone fails to clear//breakup and pass given its large size and current location. We did discuss the complications of shockwave lithotripsy including need for additional procedures, hematoma, obstructing fragments, and ongoing pain. Understanding expressed by the patient with all questions answered to the best of my ability. He still would like to proceed with shockwave lithotripsy. He would prefer Thursdays so I will work on getting that set up. Recommended MiraLAX for mild constipation. He has hydrocodone on hand  if needed. Explicit ED follow-up instructions for over the weekend discussed for uncontrollable pain, fevers, uncontrollable nausea/vomiting, painful inability to void. Also I prescribed tamsulosin. Appropriate use and side effects discussed regarding that medication. Repeat urine culture sent. We will telephone him to set up ESWL.

## 2018-04-20 ENCOUNTER — Encounter (HOSPITAL_COMMUNITY): Payer: Self-pay | Admitting: *Deleted

## 2018-04-20 ENCOUNTER — Ambulatory Visit (HOSPITAL_COMMUNITY): Payer: BC Managed Care – PPO

## 2018-04-20 ENCOUNTER — Ambulatory Visit (HOSPITAL_COMMUNITY)
Admission: RE | Admit: 2018-04-20 | Discharge: 2018-04-20 | Disposition: A | Discharge status: Home / Self Care | Payer: BC Managed Care – PPO | Source: Ambulatory Visit | Attending: Urology | Admitting: Urology

## 2018-04-20 ENCOUNTER — Encounter (HOSPITAL_COMMUNITY)
Admission: RE | Disposition: A | Discharge status: Home / Self Care | Payer: Self-pay | Source: Ambulatory Visit | Attending: Urology

## 2018-04-20 ENCOUNTER — Other Ambulatory Visit: Payer: Self-pay

## 2018-04-20 DIAGNOSIS — N132 Hydronephrosis with renal and ureteral calculous obstruction: Secondary | ICD-10-CM | POA: Diagnosis not present

## 2018-04-20 DIAGNOSIS — Z7982 Long term (current) use of aspirin: Secondary | ICD-10-CM | POA: Insufficient documentation

## 2018-04-20 DIAGNOSIS — Z87442 Personal history of urinary calculi: Secondary | ICD-10-CM | POA: Insufficient documentation

## 2018-04-20 DIAGNOSIS — N200 Calculus of kidney: Secondary | ICD-10-CM

## 2018-04-20 DIAGNOSIS — Z8249 Family history of ischemic heart disease and other diseases of the circulatory system: Secondary | ICD-10-CM | POA: Diagnosis not present

## 2018-04-20 HISTORY — DX: Personal history of urinary calculi: Z87.442

## 2018-04-20 HISTORY — PX: EXTRACORPOREAL SHOCK WAVE LITHOTRIPSY: SHX1557

## 2018-04-20 SURGERY — LITHOTRIPSY, ESWL
Anesthesia: LOCAL | Laterality: Left

## 2018-04-20 MED ORDER — MORPHINE SULFATE (PF) 4 MG/ML IV SOLN: 2.0000 mg | INTRAVENOUS | Status: DC | PRN

## 2018-04-20 MED ORDER — DIAZEPAM 5 MG PO TABS
10.0000 mg | ORAL_TABLET | ORAL | Status: AC
  Administered 2018-04-20: 10 mg via ORAL
  Filled 2018-04-20: qty 2

## 2018-04-20 MED ORDER — SODIUM CHLORIDE 0.9% FLUSH: 3.0000 mL | INTRAVENOUS | Status: DC | PRN

## 2018-04-20 MED ORDER — SODIUM CHLORIDE 0.9 % IV SOLN
INTRAVENOUS | Status: DC
  Administered 2018-04-20 (×2): via INTRAVENOUS

## 2018-04-20 MED ORDER — CIPROFLOXACIN HCL 500 MG PO TABS
500.0000 mg | ORAL_TABLET | ORAL | Status: AC
  Administered 2018-04-20: 500 mg via ORAL
  Filled 2018-04-20: qty 1

## 2018-04-20 MED ORDER — SODIUM CHLORIDE 0.9% FLUSH: 3.0000 mL | Freq: Two times a day (BID) | INTRAVENOUS | Status: DC

## 2018-04-20 MED ORDER — OXYCODONE HCL 5 MG PO TABS
5.0000 mg | ORAL_TABLET | ORAL | Status: DC | PRN
  Administered 2018-04-20: 5 mg via ORAL
  Filled 2018-04-20: qty 1

## 2018-04-20 MED ORDER — ACETAMINOPHEN 325 MG PO TABS
650.0000 mg | ORAL_TABLET | ORAL | Status: DC | PRN
Start: 2018-04-20 — End: 2018-04-20

## 2018-04-20 MED ORDER — DIPHENHYDRAMINE HCL 25 MG PO CAPS
25.0000 mg | ORAL_CAPSULE | ORAL | Status: AC
  Administered 2018-04-20: 25 mg via ORAL
  Filled 2018-04-20: qty 1

## 2018-04-20 MED ORDER — ACETAMINOPHEN 650 MG RE SUPP
650.0000 mg | RECTAL | Status: DC | PRN
  Filled 2018-04-20: qty 1

## 2018-04-20 MED ORDER — SODIUM CHLORIDE 0.9 % IV SOLN: 250.0000 mL | INTRAVENOUS | Status: DC | PRN

## 2018-04-20 NOTE — Discharge Instructions (Addendum)
Moderate Conscious Sedation, Adult, Care After °These instructions provide you with information about caring for yourself after your procedure. Your health care provider may also give you more specific instructions. Your treatment has been planned according to current medical practices, but problems sometimes occur. Call your health care provider if you have any problems or questions after your procedure. °What can I expect after the procedure? °After your procedure, it is common: °To feel sleepy for several hours. °To feel clumsy and have poor balance for several hours. °To have poor judgment for several hours. °To vomit if you eat too soon. ° °Follow these instructions at home: °For at least 24 hours after the procedure: ° °Do not: °Participate in activities where you could fall or become injured. °Drive. °Use heavy machinery. °Drink alcohol. °Take sleeping pills or medicines that cause drowsiness. °Make important decisions or sign legal documents. °Take care of children on your own. °Rest. °Eating and drinking °Follow the diet recommended by your health care provider. °If you vomit: °Drink water, juice, or soup when you can drink without vomiting. °Make sure you have little or no nausea before eating solid foods. °General instructions °Have a responsible adult stay with you until you are awake and alert. °Take over-the-counter and prescription medicines only as told by your health care provider. °If you smoke, do not smoke without supervision. °Keep all follow-up visits as told by your health care provider. This is important. °Contact a health care provider if: °You keep feeling nauseous or you keep vomiting. °You feel light-headed. °You develop a rash. °You have a fever. °Get help right away if: °You have trouble breathing. °This information is not intended to replace advice given to you by your health care provider. Make sure you discuss any questions you have with your health care provider. °Document Released:  06/20/2013 Document Revised: 02/02/2016 Document Reviewed: 12/20/2015 °Elsevier Interactive Patient Education © 2018 [employer] °Lithotripsy, Care After °This sheet gives you information about how to care for yourself after your procedure. Your health care provider may also give you more specific instructions. If you have problems or questions, contact your health care provider. °What can I expect after the procedure? °After the procedure, it is common to have: °· Some blood in your urine. This should only last for a few days. °· Soreness in your back, sides, or upper abdomen for a few days. °· Blotches or bruises on your back where the pressure wave entered the skin. °· Pain, discomfort, or nausea when pieces (fragments) of the kidney stone move through the tube that carries urine from the kidney to the bladder (ureter). Stone fragments may pass soon after the procedure, but they may continue to pass for up to 4-8 weeks. °? If you have severe pain or nausea, contact your health care provider. This may be caused by a large stone that was not broken up, and this may mean that you need more treatment. °· Some pain or discomfort during urination. °· Some pain or discomfort in the lower abdomen or (in men) at the base of the penis. ° °Follow these instructions at home: °Medicines °· Take over-the-counter and prescription medicines only as told by your health care provider. °· If you were prescribed an antibiotic medicine, take it as told by your health care provider. Do not stop taking the antibiotic even if you start to feel better. °· Do not drive for 24 hours if you were given a medicine to help you relax (sedative). °· Do not drive   or use heavy machinery while taking prescription pain medicine. °Eating and drinking °· Drink enough water and fluids to keep your urine clear or pale yellow. This helps any remaining pieces of the stone to pass. It can also help prevent new stones from forming. °· Eat plenty of fresh  fruits and vegetables. °· Follow instructions from your health care provider about eating and drinking restrictions. You may be instructed: °? To reduce how much salt (sodium) you eat or drink. Check ingredients and nutrition facts on packaged foods and beverages. °? To reduce how much meat you eat. °· Eat the recommended amount of calcium for your age and gender. Ask your health care provider how much calcium you should have. °General instructions °· Get plenty of rest. °· Most people can resume normal activities 1-2 days after the procedure. Ask your health care provider what activities are safe for you. °· If directed, strain all urine through the strainer that was provided by your health care provider. °? Keep all fragments for your health care provider to see. Any stones that are found may be sent to a medical lab for examination. The stone may be as small as a grain of salt. °· Keep all follow-up visits as told by your health care provider. This is important. °Contact a health care provider if: °· You have pain that is severe or does not get better with medicine. °· You have nausea that is severe or does not go away. °· You have blood in your urine longer than your health care provider told you to expect. °· You have more blood in your urine. °· You have pain during urination that does not go away. °· You urinate more frequently than usual and this does not go away. °· You develop a rash or any other possible signs of an allergic reaction. °Get help right away if: °· You have severe pain in your back, sides, or upper abdomen. °· You have severe pain while urinating. °· Your urine is very dark red. °· You have blood in your stool (feces). °· You cannot pass any urine at all. °· You feel a strong urge to urinate after emptying your bladder. °· You have a fever or chills. °· You develop shortness of breath, difficulty breathing, or chest pain. °· You have severe nausea that leads to persistent vomiting. °· You  faint. °Summary °· After this procedure, it is common to have some pain, discomfort, or nausea when pieces (fragments) of the kidney stone move through the tube that carries urine from the kidney to the bladder (ureter). If this pain or nausea is severe, however, you should contact your health care provider. °· Most people can resume normal activities 1-2 days after the procedure. Ask your health care provider what activities are safe for you. °· Drink enough water and fluids to keep your urine clear or pale yellow. This helps any remaining pieces of the stone to pass, and it can help prevent new stones from forming. °· If directed, strain your urine and keep all fragments for your health care provider to see. Fragments or stones may be as small as a grain of salt. °· Get help right away if you have severe pain in your back, sides, or upper abdomen or have severe pain while urinating. °This information is not intended to replace advice given to you by your health care provider. Make sure you discuss any questions you have with your health care provider. °Document Released: 09/19/2007 Document Revised:   07/21/2016 Document Reviewed: 07/21/2016 °Elsevier Interactive Patient Education © 2018 [employer] ° °

## 2018-04-20 NOTE — Interval H&P Note (Signed)
History and Physical Interval Note: No change in stone location.   04/20/2018 8:50 AM  Brian Duncan  has presented today for surgery, with the diagnosis of LEFT  RENAL STONE  The various methods of treatment have been discussed with the patient and family. After consideration of risks, benefits and other options for treatment, the patient has consented to  Procedure(s): LEFT EXTRACORPOREAL SHOCK WAVE LITHOTRIPSY (ESWL) (Left) as a surgical intervention .  The patient's history has been reviewed, patient examined, no change in status, stable for surgery.  I have reviewed the patient's chart and labs.  Questions were answered to the patient's satisfaction.     [name]

## 2018-04-20 NOTE — Progress Notes (Signed)
Pt aware to not rebegin aspirin till urine clear from blood

## 2018-05-29 ENCOUNTER — Ambulatory Visit (INDEPENDENT_AMBULATORY_CARE_PROVIDER_SITE_OTHER): Payer: BC Managed Care – PPO | Admitting: Family Medicine

## 2018-05-29 ENCOUNTER — Encounter: Payer: Self-pay | Admitting: Family Medicine

## 2018-05-29 VITALS — BP 102/65 | HR 71 | Temp 98.0°F | Resp 16 | Ht 75.0 in | Wt 179.0 lb

## 2018-05-29 DIAGNOSIS — Z0000 Encounter for general adult medical examination without abnormal findings: Secondary | ICD-10-CM | POA: Diagnosis not present

## 2018-05-29 DIAGNOSIS — Z87442 Personal history of urinary calculi: Secondary | ICD-10-CM

## 2018-05-29 DIAGNOSIS — Z125 Encounter for screening for malignant neoplasm of prostate: Secondary | ICD-10-CM | POA: Diagnosis not present

## 2018-05-29 DIAGNOSIS — N529 Male erectile dysfunction, unspecified: Secondary | ICD-10-CM | POA: Diagnosis not present

## 2018-05-29 DIAGNOSIS — Z23 Encounter for immunization: Secondary | ICD-10-CM | POA: Diagnosis not present

## 2018-05-29 NOTE — Progress Notes (Signed)
Patient: Brian Duncan, Male    DOB: [birth year], 62 y.o.   MRN: [id number] Visit Date: 05/29/2018  Today's Provider: Lelon Huh, MD   Chief Complaint  Patient presents with  . Annual Exam   Subjective:    Annual physical exam Brian Duncan is a 62 y.o. male who presents today for health maintenance and complete physical. He feels well. He reports exercising none. He reports he is sleeping not well right now, dur to stress.  ---------------------------------------------------------------   Review of Systems  Constitutional: Negative for chills, diaphoresis and fever.  HENT: Positive for tinnitus. Negative for congestion, ear discharge, ear pain, hearing loss, nosebleeds and sore throat.   Eyes: Negative for photophobia, pain, discharge and redness.  Respiratory: Negative for cough, shortness of breath, wheezing and stridor.   Cardiovascular: Negative for chest pain, palpitations and leg swelling.  Gastrointestinal: Negative for abdominal pain, blood in stool, constipation, diarrhea, nausea and vomiting.  Endocrine: Negative for polydipsia.  Genitourinary: Negative for dysuria, flank pain, frequency, hematuria and urgency.  Musculoskeletal: Negative for back pain, myalgias and neck pain.  Skin: Negative for rash.  Allergic/Immunologic: Negative for environmental allergies.  Neurological: Negative for dizziness, tremors, seizures, weakness and headaches.  Hematological: Does not bruise/bleed easily.  Psychiatric/Behavioral: Negative for hallucinations and suicidal ideas. The patient is not nervous/anxious.     Social History      He  reports that he has never smoked. He has never used smokeless tobacco. He reports that he does not drink alcohol or use drugs.       Social History   Socioeconomic History  . Marital status: Married    Spouse name: Not on file  . Number of children: 0  . Years of education: Not on file  . Highest education level: Not on file    Occupational History  . 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  . Financial resource strain: Not on file  . Food insecurity:    Worry: Not on file    Inability: Not on file  . Transportation needs:    Medical: Not on file    Non-medical: Not on file  Tobacco Use  . Smoking status: Never Smoker  . Smokeless tobacco: Never Used  Substance and Sexual Activity  . Alcohol use: No    Alcohol/week: 0.0 standard drinks  . Drug use: No  . Sexual activity: Not on file  Lifestyle  . Physical activity:    Days per week: Not on file    Minutes per session: Not on file  . Stress: Not on file  Relationships  . Social connections:    Talks on phone: Not on file    Gets together: Not on file    Attends religious service: Not on file    Active member of club or organization: Not on file    Attends meetings of clubs or organizations: Not on file    Relationship status: Not on file  Other Topics Concern  . Not on file  Social History Narrative  . Not on file    Past Medical History:  Diagnosis Date  . History of chicken pox   . History of kidney stones   . Hx of measles   . Kidney stone   . Status post Mohs surgery for basal cell carcinoma 08/21/2013   [facility]     Patient Active Problem List   Diagnosis Date Noted  . OA (osteoarthritis)  of knee 05/13/2016  . Hypogonadism in male 05/12/2015  . Arthritis 05/09/2015  . 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 Hematuria 05/09/2015  . Personal history of kidney stones 05/09/2015  . Basal cell carcinoma of ear 08/21/2013    Past Surgical History:  Procedure Laterality Date  . COLONOSCOPY  2009   per pt report was advised to repeat in 10 years  . MOHS SURGERY  2014   Right Ear  . TONSILLECTOMY      Family History        Family Status  Relation Name Status  . Mother  Deceased at age 21  . Father  Alive  . Sister  Alive  . Brother  Deceased at age 19        His family  history includes Diabetes in his mother; Heart disease in his brother, father, and mother; Prostate cancer in his father; Uterine cancer in his sister.      No Known Allergies   Current Outpatient Medications:  .  aspirin 81 MG tablet, Take 81 mg by mouth daily., Disp: , Rfl:  .  CALCIUM PO, Take by mouth., Disp: , Rfl:  .  Multiple Vitamins-Minerals (MULTIVITAMIN PO), Take 1 capsule by mouth daily., Disp: , Rfl:  .  tamsulosin (FLOMAX) 0.4 MG CAPS capsule, Take 0.4 mg by mouth., Disp: , Rfl:  .  vitamin E 400 UNIT capsule, Take 400 Units by mouth daily., Disp: , Rfl:  .  HYDROcodone-acetaminophen (NORCO) 10-325 MG tablet, Take 1 tablet by mouth every 6 (six) hours as needed. (Patient not taking: Reported on 05/29/2018), Disp: 15 tablet, Rfl: 0 .  ondansetron (ZOFRAN-ODT) 8 MG disintegrating tablet, Take 1 tablet (8 mg total) by mouth every 8 (eight) hours as needed for nausea or vomiting. (Patient not taking: Reported on 05/29/2018), Disp: 20 tablet, Rfl: 0 .  sildenafil (VIAGRA) 100 MG tablet, Take 0.5-1 tablets by mouth. 30 minutes before sexual activity, no more than one in a day, Disp: , Rfl:    Patient Care Team: Birdie Sons, MD as PCP - General (Family Medicine) Dasher, Rayvon Char, MD (Dermatology)      Objective:   Vitals: BP 102/65 (BP Location: Right Arm, Patient Position: Sitting, Cuff Size: Large)   Pulse 71   Temp 98 F (36.7 C) (Oral)   Resp 16   Ht 6\' 3"  (1.905 m)   Wt 179 lb (81.2 kg)   SpO2 97%   BMI 22.37 kg/m    Vitals:   05/29/18 1020  BP: 102/65  Pulse: 71  Resp: 16  Temp: 98 F (36.7 C)  TempSrc: Oral  SpO2: 97%  Weight: 179 lb (81.2 kg)  Height: 6\' 3"  (1.905 m)     Physical Exam   General Appearance:    Alert, cooperative, no distress, appears stated age  Head:    Normocephalic, without obvious abnormality, atraumatic  Eyes:    PERRL, conjunctiva/corneas clear, EOM's intact, fundi    benign, both eyes       Ears:    Normal TM's and external  ear canals, both ears  Nose:   Nares normal, septum midline, mucosa normal, no drainage   or sinus tenderness  Throat:   Lips, mucosa, and tongue normal; teeth and gums normal  Neck:   Supple, symmetrical, trachea midline, no adenopathy;       thyroid:  No enlargement/tenderness/nodules; no carotid   bruit or JVD  Back:     Symmetric, no curvature,  ROM normal, no CVA tenderness  Lungs:     Clear to auscultation bilaterally, respirations unlabored  Chest wall:    No tenderness or deformity  Heart:    Regular rate and rhythm, S1 and S2 normal, no murmur, rub   or gallop  Abdomen:     Soft, non-tender, bowel sounds active all four quadrants,    no masses, no organomegaly  Genitalia:    deferred  Rectal:    deferred  Extremities:   Extremities normal, atraumatic, no cyanosis or edema  Pulses:   2+ and symmetric all extremities  Skin:   Skin color, texture, turgor normal, no rashes or lesions  Lymph nodes:   Cervical, supraclavicular, and axillary nodes normal  Neurologic:   CNII-XII intact. Normal strength, sensation and reflexes      throughout    Depression Screen PHQ 2/9 Scores 05/19/2017 05/13/2016 05/12/2015  PHQ - 2 Score 0 0 0  PHQ- 9 Score 1 0 1      Assessment & Plan:     Routine Health Maintenance and Physical Exam  Exercise Activities and Dietary recommendations Goals   None     Immunization History  Administered Date(s) Administered  . Influenza,inj,Quad PF,6+ Mos 05/12/2015, 05/13/2016, 05/19/2017  . Tdap 03/12/2010  . Zoster Recombinat (Shingrix) 05/19/2017, 07/25/2017    Health Maintenance  Topic Date Due  . Hepatitis C Screening  1955-09-21  . HIV Screening  08/27/1973  . COLONOSCOPY  09/13/2017  . INFLUENZA VACCINE  04/13/2018  . TETANUS/TDAP  03/12/2020     Discussed health benefits of physical activity, and encouraged him to engage in regular exercise appropriate for his age and condition.      --------------------------------------------------------------------  1. Annual physical exam  - PSA - Testosterone,Free and Total - Lipid panel  2. Personal history of kidney stones Recently had lithotripsy with resolution of sx.   3. ED (erectile dysfunction) of organic origin He states he feels like testosterone must be getting low due to low semen volume.  - Testosterone,Free and Total  4. Prostate cancer screening  - PSA  5. Need for influenza vaccination  - Flu Vaccine QUAD 36+ mos IM   Lelon Huh, MD  [facility]

## 2018-05-30 LAB — TESTOSTERONE,FREE AND TOTAL
Testosterone, Free: 7.4 pg/mL (ref 7.2–24.0)
Testosterone: 918 ng/dL — ABNORMAL HIGH (ref 264–916)

## 2018-05-30 LAB — PSA: Prostate Specific Ag, Serum: 1.3 ng/mL (ref 0.0–4.0)

## 2018-05-30 LAB — LIPID PANEL
CHOL/HDL RATIO: 2.8 ratio (ref 0.0–5.0)
Cholesterol, Total: 185 mg/dL (ref 100–199)
HDL: 65 mg/dL (ref >39)
LDL Calculated: 106 mg/dL — ABNORMAL HIGH (ref 0–99)
Triglycerides: 71 mg/dL (ref 0–149)
VLDL CHOLESTEROL CAL: 14 mg/dL (ref 5–40)

## 2018-07-03 ENCOUNTER — Encounter (HOSPITAL_COMMUNITY): Payer: Self-pay | Admitting: Urology

## 2018-10-04 ENCOUNTER — Telehealth: Payer: Self-pay | Admitting: Family Medicine

## 2018-10-04 DIAGNOSIS — Z1211 Encounter for screening for malignant neoplasm of colon: Secondary | ICD-10-CM

## 2018-10-04 NOTE — Telephone Encounter (Signed)
done

## 2018-10-04 NOTE — Telephone Encounter (Signed)
Pt wanting to go ahead with his 10 year coloscopy referral.  He had one done at 82 and now he is 30.  Please advise.  Thanks, [employer]

## 2018-10-04 NOTE — Telephone Encounter (Signed)
Dr. Caryn Section, is it ok to place order for colonoscopy referral? I have pended an order in this encounter. Is  GI ok?

## 2018-10-05 ENCOUNTER — Other Ambulatory Visit: Payer: Self-pay

## 2018-10-05 DIAGNOSIS — Z1211 Encounter for screening for malignant neoplasm of colon: Secondary | ICD-10-CM

## 2018-11-13 ENCOUNTER — Encounter: Payer: Self-pay | Admitting: Student

## 2018-11-14 ENCOUNTER — Ambulatory Visit: Payer: BC Managed Care – PPO | Admitting: Anesthesiology

## 2018-11-14 ENCOUNTER — Ambulatory Visit
Admission: RE | Admit: 2018-11-14 | Discharge: 2018-11-14 | Disposition: A | Discharge status: Home / Self Care | Payer: BC Managed Care – PPO | Attending: Gastroenterology | Admitting: Gastroenterology

## 2018-11-14 ENCOUNTER — Encounter
Admission: RE | Disposition: A | Discharge status: Home / Self Care | Payer: Self-pay | Source: Home / Self Care | Attending: Gastroenterology

## 2018-11-14 ENCOUNTER — Encounter: Payer: Self-pay | Admitting: *Deleted

## 2018-11-14 DIAGNOSIS — Z7982 Long term (current) use of aspirin: Secondary | ICD-10-CM | POA: Diagnosis not present

## 2018-11-14 DIAGNOSIS — Z1211 Encounter for screening for malignant neoplasm of colon: Secondary | ICD-10-CM | POA: Diagnosis present

## 2018-11-14 DIAGNOSIS — Z87442 Personal history of urinary calculi: Secondary | ICD-10-CM | POA: Insufficient documentation

## 2018-11-14 DIAGNOSIS — Z85828 Personal history of other malignant neoplasm of skin: Secondary | ICD-10-CM | POA: Diagnosis not present

## 2018-11-14 HISTORY — PX: COLONOSCOPY WITH PROPOFOL: SHX5780

## 2018-11-14 SURGERY — COLONOSCOPY WITH PROPOFOL
Anesthesia: General

## 2018-11-14 MED ORDER — LIDOCAINE HCL (PF) 1 % IJ SOLN
2.0000 mL | Freq: Once | INTRAMUSCULAR | Status: AC
  Administered 2018-11-14: 0.3 mL via INTRADERMAL

## 2018-11-14 MED ORDER — PROPOFOL 10 MG/ML IV BOLUS
INTRAVENOUS | Status: DC | PRN
  Administered 2018-11-14: 20 mg via INTRAVENOUS
  Administered 2018-11-14: 90 mg via INTRAVENOUS

## 2018-11-14 MED ORDER — SODIUM CHLORIDE 0.9 % IV SOLN
INTRAVENOUS | Status: DC
  Administered 2018-11-14: 1000 mL via INTRAVENOUS
  Administered 2018-11-14: 11:00:00 via INTRAVENOUS

## 2018-11-14 MED ORDER — PROPOFOL 500 MG/50ML IV EMUL
INTRAVENOUS | Status: DC | PRN
  Administered 2018-11-14: 100 ug/kg/min via INTRAVENOUS

## 2018-11-14 MED ORDER — LIDOCAINE HCL (CARDIAC) PF 100 MG/5ML IV SOSY
PREFILLED_SYRINGE | INTRAVENOUS | Status: DC | PRN
  Administered 2018-11-14: 100 mg via INTRAVENOUS

## 2018-11-14 MED ORDER — LIDOCAINE HCL (PF) 1 % IJ SOLN
INTRAMUSCULAR | Status: AC
  Administered 2018-11-14: 0.3 mL via INTRADERMAL
  Filled 2018-11-14: qty 2

## 2018-11-14 NOTE — Transfer of Care (Signed)
Immediate Anesthesia Transfer of Care Note  Patient: Brian Duncan  Procedure(s) Performed: COLONOSCOPY WITH PROPOFOL (N/A )  Patient Location: PACU and Endoscopy Unit  Anesthesia Type:General  Level of Consciousness: awake, drowsy and patient cooperative  Airway & Oxygen Therapy: Patient Spontanous Breathing  Post-op Assessment: Report given to RN, Post -op Vital signs reviewed and stable and Patient moving all extremities  Post vital signs: Reviewed and stable  Last Vitals:  Vitals Value Taken Time  BP 94/64 11/14/2018 11:01 AM  Temp 36.2 C 11/14/2018 11:01 AM  Pulse 74 11/14/2018 11:03 AM  Resp 19 11/14/2018 11:03 AM  SpO2 100 % 11/14/2018 11:03 AM  Vitals shown include unvalidated device data.  Last Pain:  Vitals:   11/14/18 1101  TempSrc: Tympanic  PainSc: Asleep         Complications: No apparent anesthesia complications

## 2018-11-14 NOTE — Anesthesia Postprocedure Evaluation (Signed)
Anesthesia Post Note  Patient: Brian Duncan  Procedure(s) Performed: COLONOSCOPY WITH PROPOFOL (N/A )  Patient location during evaluation: Endoscopy Anesthesia Type: General Level of consciousness: awake and alert and oriented Pain management: pain level controlled Vital Signs Assessment: post-procedure vital signs reviewed and stable Respiratory status: spontaneous breathing, nonlabored ventilation and respiratory function stable Cardiovascular status: blood pressure returned to baseline and stable Postop Assessment: no signs of nausea or vomiting Anesthetic complications: no     Last Vitals:  Vitals:   11/14/18 1121 11/14/18 1131  BP: 114/71 (!) 115/91  Pulse: 63 65  Resp: 14 15  Temp:    SpO2: 100% 100%    Last Pain:  Vitals:   11/14/18 1131  TempSrc:   PainSc: 0-No pain                 Lovetta Condie

## 2018-11-14 NOTE — Anesthesia Preprocedure Evaluation (Signed)
Anesthesia Evaluation  Patient identified by MRN, date of birth, ID band Patient awake    Reviewed: Allergy & Precautions, NPO status , Patient's Chart, lab work & pertinent test results  History of Anesthesia Complications Negative for: history of anesthetic complications  Airway Mallampati: II  TM Distance: >3 FB Neck ROM: Full    Dental no notable dental hx.    Pulmonary neg pulmonary ROS, neg sleep apnea, neg COPD,    breath sounds clear to auscultation- rhonchi (-) wheezing      Cardiovascular Exercise Tolerance: Good (-) hypertension(-) CAD, (-) Past MI, (-) Cardiac Stents and (-) CABG  Rhythm:Regular Rate:Normal - Systolic murmurs and - Diastolic murmurs    Neuro/Psych neg Seizures negative neurological ROS  negative psych ROS   GI/Hepatic negative GI ROS, Neg liver ROS,   Endo/Other  negative endocrine ROSneg diabetes  Renal/GU Renal disease: hx of nephrolithiasis.     Musculoskeletal  (+) Arthritis ,   Abdominal (+) - obese,   Peds  Hematology negative hematology ROS (+)   Anesthesia Other Findings Past Medical History: No date: History of chicken pox No date: History of kidney stones No date: Hx of measles No date: Kidney stone 08/21/2013: Status post Mohs surgery for basal cell carcinoma     Comment:  [facility]   Reproductive/Obstetrics                             Anesthesia Physical Anesthesia Plan  ASA: II  Anesthesia Plan: General   Post-op Pain Management:    Induction: Intravenous  PONV Risk Score and Plan: 1 and Propofol infusion  Airway Management Planned: Natural Airway  Additional Equipment:   Intra-op Plan:   Post-operative Plan:   Informed Consent: I have reviewed the patients History and Physical, chart, labs and discussed the procedure including the risks, benefits and alternatives for the proposed anesthesia with the patient or authorized  representative who has indicated his/her understanding and acceptance.     Dental advisory given  Plan Discussed with: CRNA and Anesthesiologist  Anesthesia Plan Comments:         Anesthesia Quick Evaluation

## 2018-11-14 NOTE — Anesthesia Post-op Follow-up Note (Signed)
Anesthesia QCDR form completed.        

## 2018-11-14 NOTE — H&P (Signed)
Lucilla Lame, MD [address] [place], [place] [address] Phone: [phone] Fax : [phone]  Primary Care Physician:  Birdie Sons, MD Primary Gastroenterologist:  Dr. Allen Norris  Pre-Procedure History & Physical: HPI:  Brian Duncan is a 63 y.o. male is here for a screening colonoscopy.   Past Medical History:  Diagnosis Date  . History of chicken pox   . History of kidney stones   . Hx of measles   . Kidney stone   . Status post Mohs surgery for basal cell carcinoma 08/21/2013   [facility]    Past Surgical History:  Procedure Laterality Date  . COLONOSCOPY  2009   per pt report was advised to repeat in 10 years  . EXTRACORPOREAL SHOCK WAVE LITHOTRIPSY Left 04/20/2018   Procedure: LEFT EXTRACORPOREAL SHOCK WAVE LITHOTRIPSY (ESWL);  Surgeon: [name], MD;  Location: [facility] ORS;  Service: Urology;  Laterality: Left;  . MOHS SURGERY  2014   Right Ear  . TONSILLECTOMY      Prior to Admission medications   Medication Sig Start Date End Date Taking? Authorizing Provider  aspirin 81 MG tablet Take 81 mg by mouth daily.    [provider]  CALCIUM PO Take by mouth.    [provider]  Multiple Vitamins-Minerals (MULTIVITAMIN PO) Take 1 capsule by mouth daily.    [provider]  sildenafil (VIAGRA) 100 MG tablet Take 0.5-1 tablets by mouth. 30 minutes before sexual activity, no more than one in a day 04/23/14   [provider]  tamsulosin (FLOMAX) 0.4 MG CAPS capsule Take 0.4 mg by mouth.    [provider]  vitamin E 400 UNIT capsule Take 400 Units by mouth daily.    [provider]    Allergies as of 10/05/2018  . (No Known Allergies)    Family History  Problem Relation Age of Onset  . Diabetes Mother   . Heart disease Mother   . Heart disease Father   . Prostate cancer Father   . Uterine cancer Sister   . Heart disease Brother     Social History   Socioeconomic History  . Marital status: Married   Spouse name: Not on file  . Number of children: 0  . Years of education: Not on file  . Highest education level: Not on file  Occupational History  . 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  . Financial resource strain: Not on file  . Food insecurity:    Worry: Not on file    Inability: Not on file  . Transportation needs:    Medical: Not on file    Non-medical: Not on file  Tobacco Use  . Smoking status: Never Smoker  . Smokeless tobacco: Never Used  Substance and Sexual Activity  . Alcohol use: No    Alcohol/week: 0.0 standard drinks  . Drug use: No  . Sexual activity: Not on file  Lifestyle  . Physical activity:    Days per week: Not on file    Minutes per session: Not on file  . Stress: Not on file  Relationships  . Social connections:    Talks on phone: Not on file    Gets together: Not on file    Attends religious service: Not on file    Active member of club or organization: Not on file    Attends meetings of clubs or organizations: Not on file    Relationship  status: Not on file  . Intimate partner violence:    Fear of current or ex partner: Not on file    Emotionally abused: Not on file    Physically abused: Not on file    Forced sexual activity: Not on file  Other Topics Concern  . Not on file  Social History Narrative  . Not on file    Review of Systems: See HPI, otherwise negative ROS  Physical Exam: BP 117/66   Pulse 75   Temp (!) 96.6 F (35.9 C) (Tympanic)   Resp 16   Ht 6\' 3"  (1.905 m)   Wt 79.4 kg   BMI 21.87 kg/m  General:   Alert,  pleasant and cooperative in NAD Head:  Normocephalic and atraumatic. Neck:  Supple; no masses or thyromegaly. Lungs:  Clear throughout to auscultation.    Heart:  Regular rate and rhythm. Abdomen:  Soft, nontender and nondistended. Normal bowel sounds, without guarding, and without rebound.   Neurologic:  Alert and  oriented x4;  grossly normal  neurologically.  Impression/Plan: Brian Duncan is now here to undergo a screening colonoscopy.  Risks, benefits, and alternatives regarding colonoscopy have been reviewed with the patient.  Questions have been answered.  All parties agreeable.

## 2018-11-15 ENCOUNTER — Encounter: Payer: Self-pay | Admitting: Gastroenterology

## 2018-11-15 NOTE — Op Note (Signed)
[facility] Gastroenterology Patient Name: Brian Duncan Procedure Date: 11/14/2018 10:37 AM MRN: [id number] Account #: [id number] Date of Birth: 10-30-1955 Admit Type: Outpatient Age: 63 Room: [facility] ENDO ROOM 4 Gender: Male Note Status: Finalized Procedure:            Colonoscopy Indications:          Screening for colorectal malignant neoplasm Providers:            Lucilla Lame MD, MD Medicines:            Propofol per Anesthesia Complications:        No immediate complications. Procedure:            Pre-Anesthesia Assessment:                       - Prior to the procedure, a History and Physical was                        performed, and patient medications and allergies were                        reviewed. The patient's tolerance of previous                        anesthesia was also reviewed. The risks and benefits of                        the procedure and the sedation options and risks were                        discussed with the patient. All questions were                        answered, and informed consent was obtained. Prior                        Anticoagulants: The patient has taken no previous                        anticoagulant or antiplatelet agents. ASA Grade                        Assessment: II - A patient with mild systemic disease.                        After reviewing the risks and benefits, the patient was                        deemed in satisfactory condition to undergo the                        procedure.                       After obtaining informed consent, the colonoscope was                        passed under direct vision. Throughout the procedure,                        the patient's blood pressure,  pulse, and oxygen                        saturations were monitored continuously. The                        Colonoscope was introduced through the anus and                        advanced to the the cecum, identified by  appendiceal                        orifice and ileocecal valve. The colonoscopy was                        performed without difficulty. The patient tolerated the                        procedure well. The quality of the bowel preparation                        was excellent. Findings:      The perianal and digital rectal examinations were normal.      The colon (entire examined portion) appeared normal. Impression:           - The entire examined colon is normal.                       - No specimens collected. Recommendation:       - Discharge patient to home.                       - Resume previous diet.                       - Continue present medications.                       - Repeat colonoscopy in 10 years for screening unless                        any change in family history or lower GI problems. Procedure Code(s):    --- Professional ---                       [phone], Colonoscopy, flexible; diagnostic, including                        collection of specimen(s) by brushing or washing, when                        performed (separate procedure) Diagnosis Code(s):    --- Professional ---                       Z12.11, Encounter for screening for malignant neoplasm                        of colon CPT copyright 2018 American Medical Association. All rights reserved. The codes documented in this report are preliminary and upon coder review may  be revised to meet current compliance requirements. Lucilla Lame MD, MD 11/14/2018 10:58:34 AM This report has  been signed electronically. Number of Addenda: 0 Note Initiated On: 11/14/2018 10:37 AM Scope Withdrawal Time: 0 hours 6 minutes 44 seconds  Total Procedure Duration: 0 hours 12 minutes 20 seconds       [facility]

## 2018-12-11 IMAGING — DX DG ABDOMEN 1V
2 series · 2 of 2 positions shown · non-contrast
Comparison: 04/14/2018 CT

CLINICAL DATA: Pre lithotripsy radiograph for LEFT renal calculus.

EXAM:
ABDOMEN - 1 VIEW

[abdomen kub (1 of 2)]
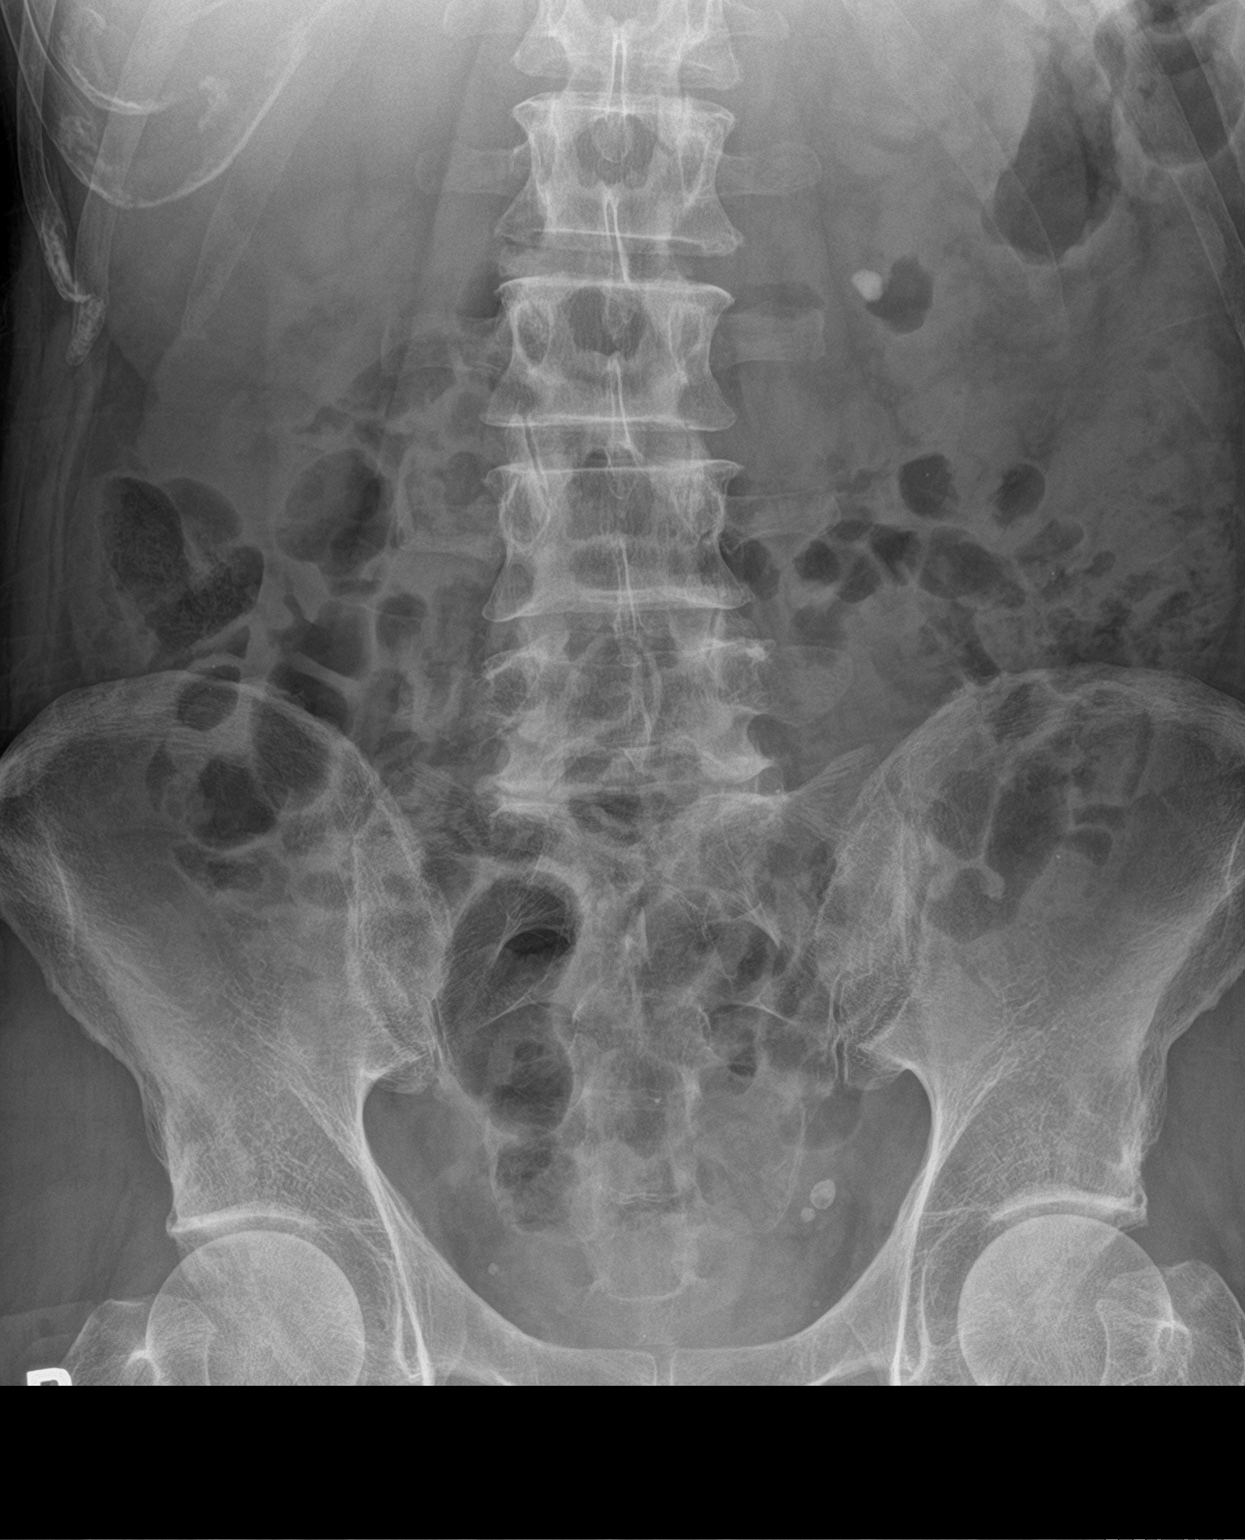

[abdomen kub (2 of 2)]
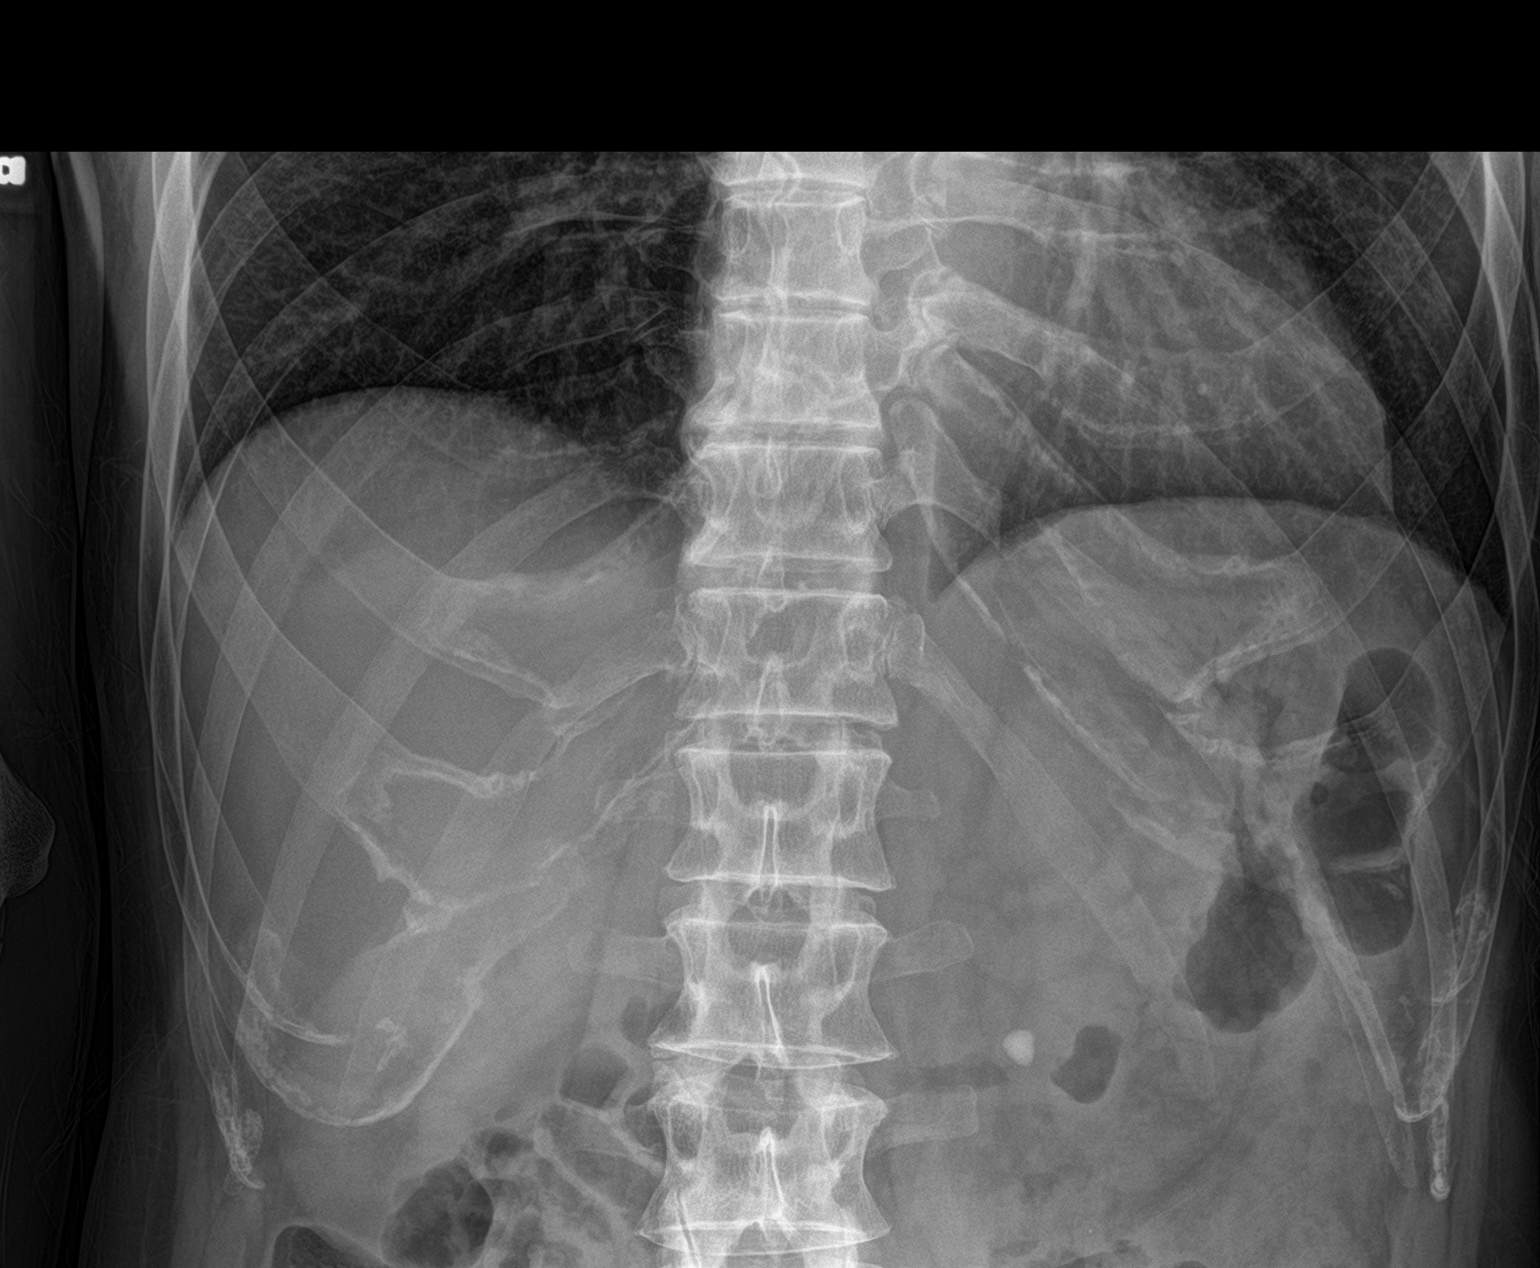

[2 of 2 positions shown; findings below may reference images not displayed]

FINDINGS: An 8 mm LEFT renal calculus is again identified.

No other suspicious calcifications overlying the renal shadows or
expected courses of the ureters noted.

The bowel gas pattern is unremarkable.

No acute bony abnormalities.
IMPRESSION: 8 mm LEFT renal calculus again noted.

## 2019-02-15 IMAGING — US US RENAL
1 series · 14 of 25 positions shown · non-contrast
Comparison: None.

CLINICAL DATA: Acute onset left flank pain with emesis

EXAM:
RENAL / URINARY TRACT ULTRASOUND COMPLETE

[Series 1: us renal · 0.22mm/px · 14 of 37 slices shown]
[im 1/37]
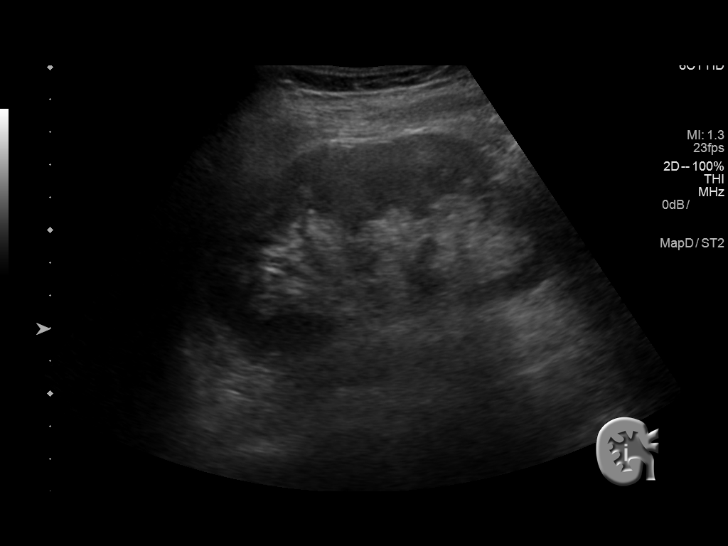
[im 4/37]
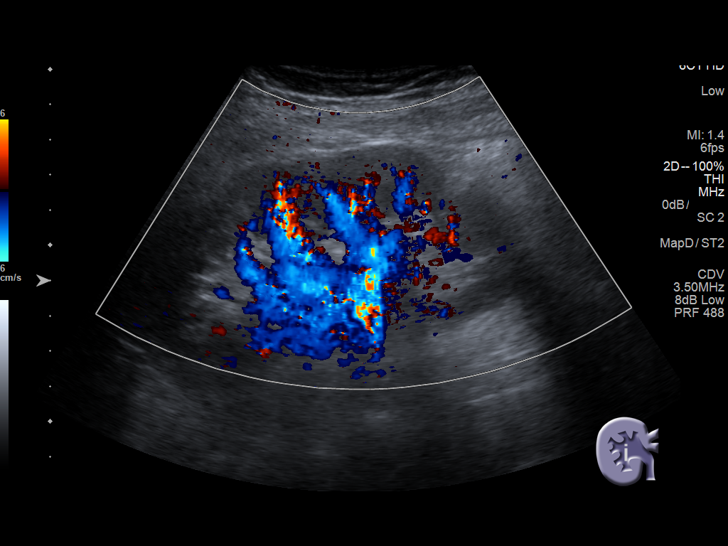
[im 7/37]
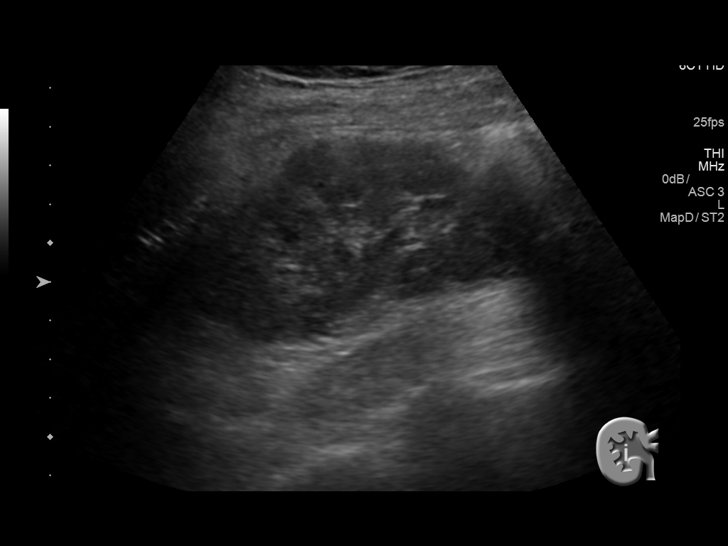
[im 10/37]
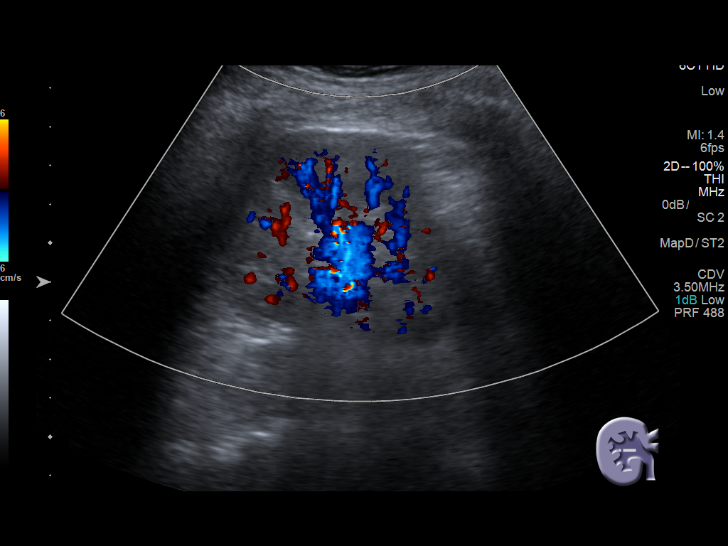
[im 13/37]
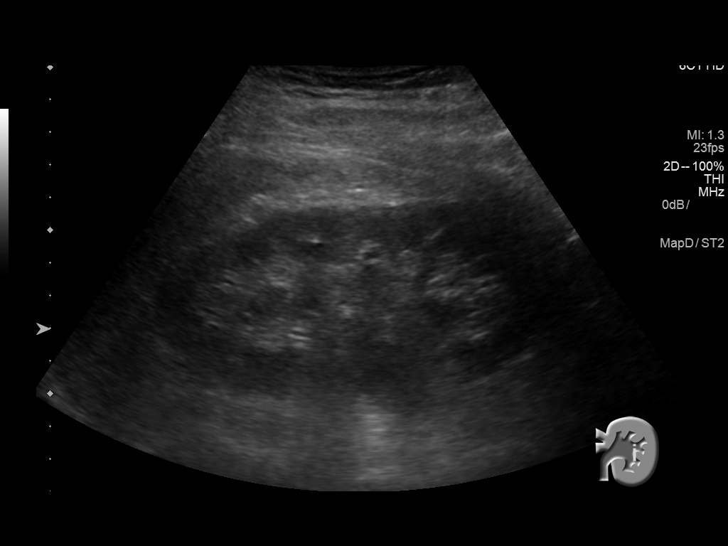
[im 14/37]
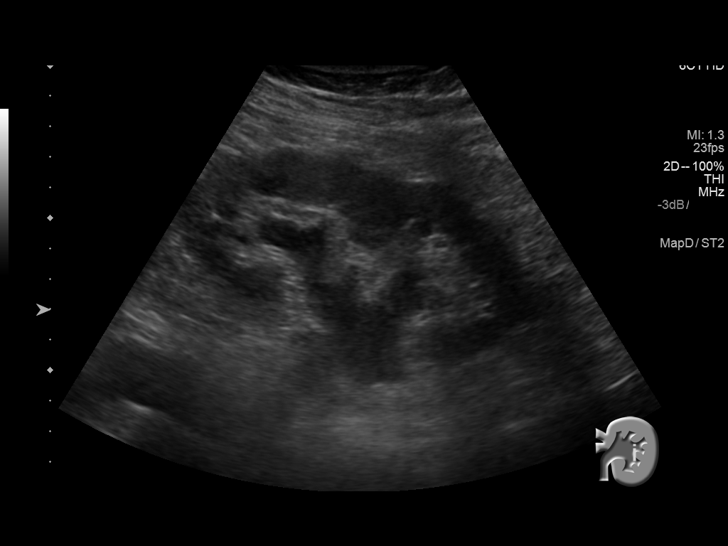
[im 17/37]
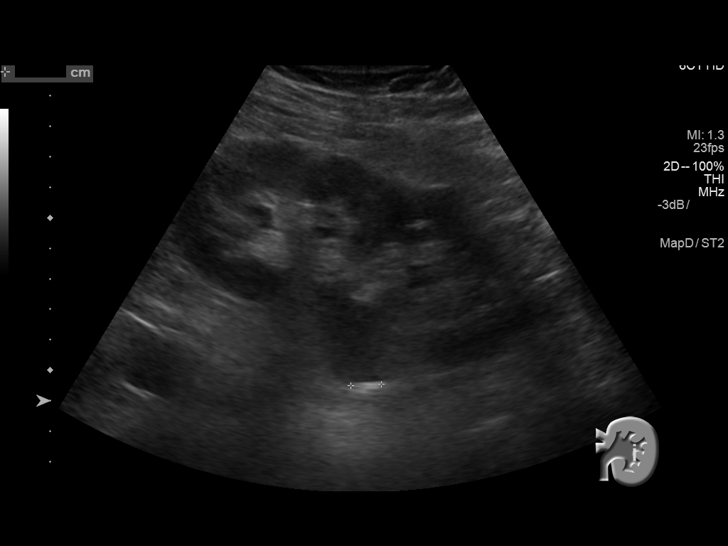
[im 20/37]
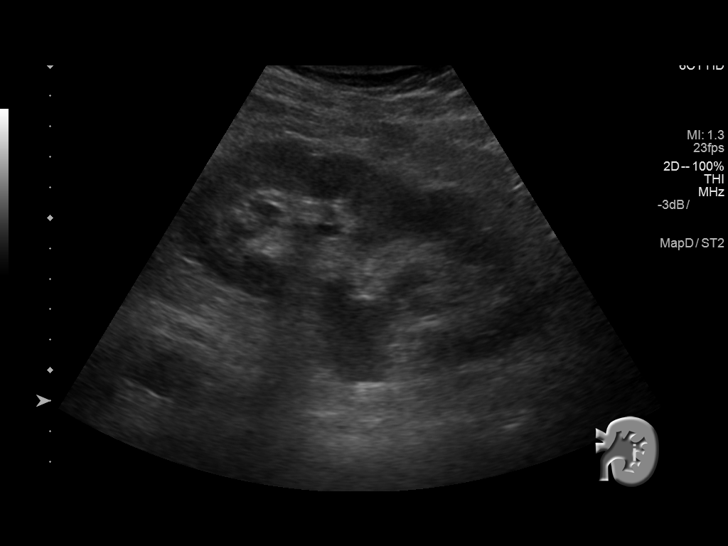
[im 23/37]
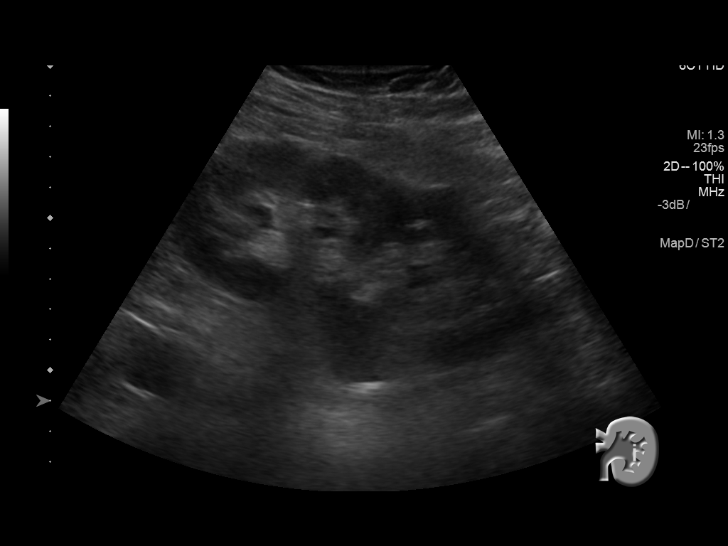
[im 25/37]
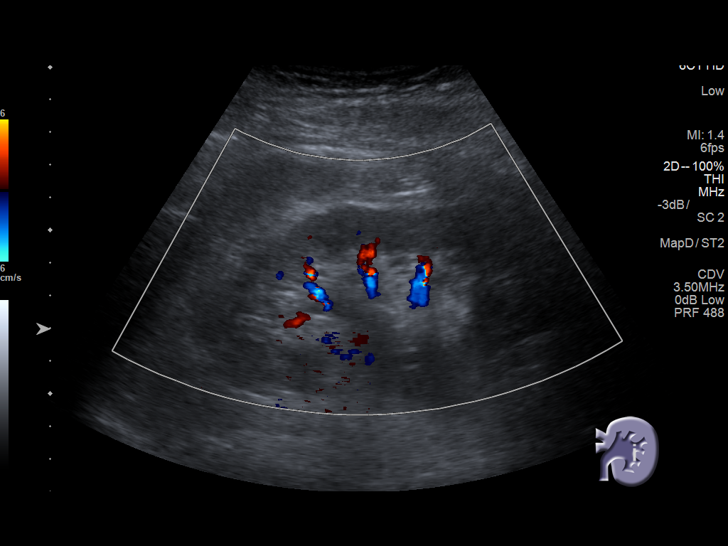
[im 28/37]
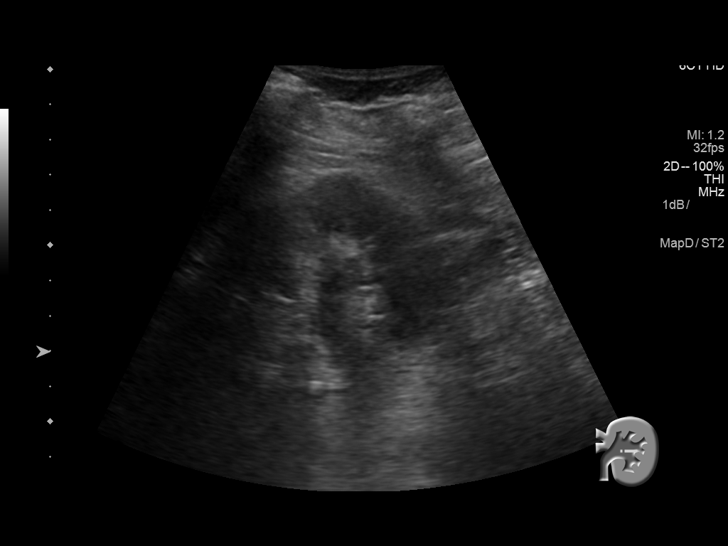
[im 31/37]
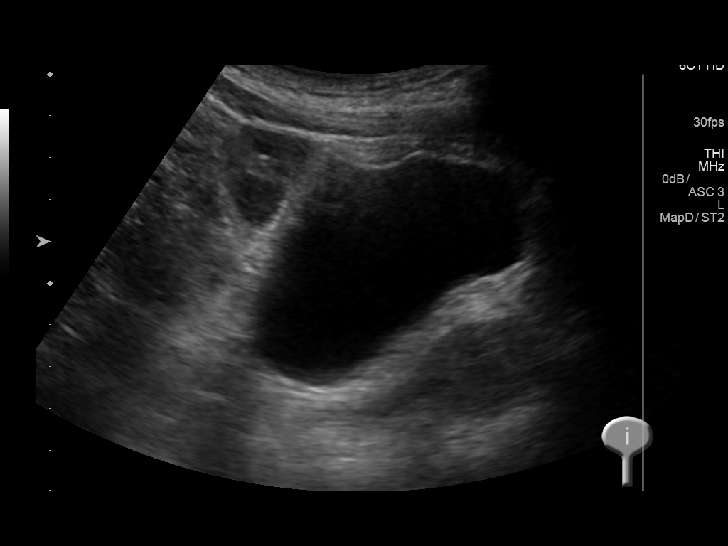
[im 34/37]
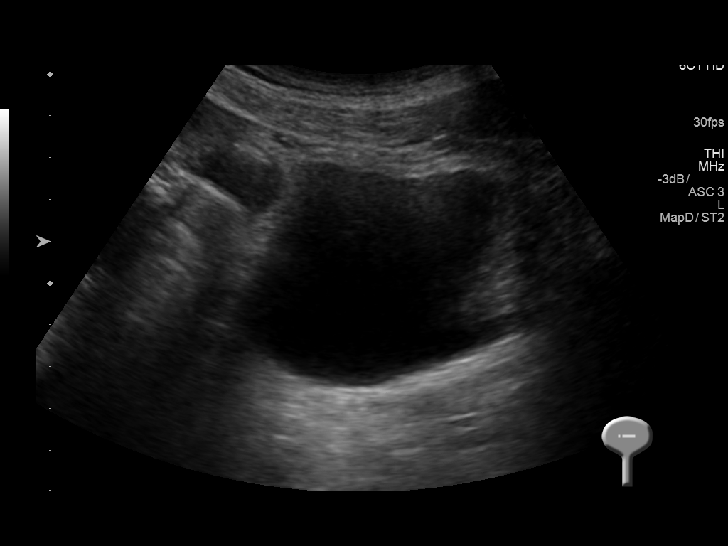
[im 37/37]
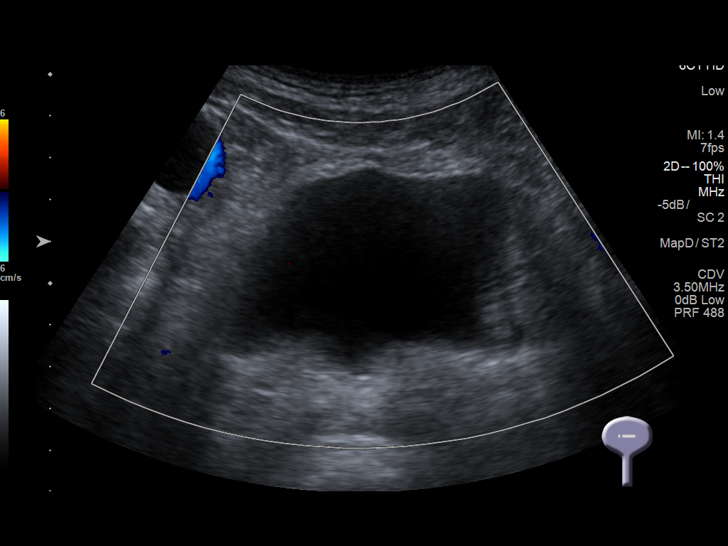

[14 of 25 positions shown; findings below may reference images not displayed]

FINDINGS: Right Kidney:

Length: 10 cm. Echogenicity within normal limits. No mass or
hydronephrosis visualized.

Left Kidney:

Length: 11.8 cm. Cortical echogenicity within normal limits.
Moderate hydronephrosis. 1 cm echogenic focus at the left UPJ.

Bladder:

Appears normal for degree of bladder distention.
IMPRESSION: 1. Moderate left hydronephrosis. 1 cm echogenic focus at the left
UPJ is suspect for obstructing stone.

## 2019-05-24 ENCOUNTER — Encounter: Payer: Self-pay | Admitting: Family Medicine

## 2019-07-10 ENCOUNTER — Encounter: Payer: Self-pay | Admitting: Family Medicine

## 2019-07-10 ENCOUNTER — Ambulatory Visit (INDEPENDENT_AMBULATORY_CARE_PROVIDER_SITE_OTHER): Payer: BC Managed Care – PPO | Admitting: Family Medicine

## 2019-07-10 ENCOUNTER — Other Ambulatory Visit: Payer: Self-pay

## 2019-07-10 VITALS — BP 100/60 | HR 60 | Temp 96.8°F | Resp 16 | Ht 76.0 in | Wt 181.0 lb

## 2019-07-10 DIAGNOSIS — Z23 Encounter for immunization: Secondary | ICD-10-CM

## 2019-07-10 DIAGNOSIS — Z0000 Encounter for general adult medical examination without abnormal findings: Secondary | ICD-10-CM

## 2019-07-10 DIAGNOSIS — Z125 Encounter for screening for malignant neoplasm of prostate: Secondary | ICD-10-CM | POA: Diagnosis not present

## 2019-07-10 DIAGNOSIS — C44211 Basal cell carcinoma of skin of unspecified ear and external auricular canal: Secondary | ICD-10-CM

## 2019-07-10 DIAGNOSIS — N529 Male erectile dysfunction, unspecified: Secondary | ICD-10-CM | POA: Diagnosis not present

## 2019-07-10 MED ORDER — SILDENAFIL CITRATE 50 MG PO TABS: 50.0000 mg | ORAL_TABLET | Freq: Every day | ORAL | 2 refills | Status: DC | PRN

## 2019-07-10 NOTE — Patient Instructions (Addendum)
.   Please review the attached list of medications and notify my office if there are any errors.   . Please bring all of your medications to every appointment so we can make sure that our medication list is the same as yours.   . Please contact your eyecare professional to schedule a routine eye exam every two years  . You can install the GoodRx app on your smart phone to find the lowest prices for generic medications.

## 2019-07-10 NOTE — Progress Notes (Signed)
Patient: Brian Duncan, Male    DOB: 04/13/1956, 63 y.o.   MRN: [id number] Visit Date: 07/10/2019  Today's Provider: Lelon Huh, MD   Chief Complaint  Patient presents with  . Annual Exam   Subjective:     Annual physical exam Brian Duncan is a 63 y.o. male who presents today for health maintenance and complete physical. He feels fairly well. He reports exercising 2 times weekly. He reports he is sleeping fairly well.  -----------------------------------------------------------------   Follow up for ED:  The patient was last seen for this 1 years ago. Changes made at last visit include none.  He reports good compliance with treatment. He feels that condition is Unchanged. He is not having side effects.  Has taken sildenafil 20 in the past and requests refill.  ------------------------------------------------------------------------------------  Review of Systems  Constitutional: Negative for appetite change, chills, fatigue and fever.  HENT: Positive for tinnitus. Negative for congestion, ear pain, hearing loss, nosebleeds and trouble swallowing.   Eyes: Negative for pain and visual disturbance.  Respiratory: Negative for cough, chest tightness and shortness of breath.   Cardiovascular: Negative for chest pain, palpitations and leg swelling.  Gastrointestinal: Negative for abdominal pain, blood in stool, constipation, diarrhea, nausea and vomiting.  Endocrine: Negative for polydipsia, polyphagia and polyuria.  Genitourinary: Negative for dysuria and flank pain.  Musculoskeletal: Negative for arthralgias, back pain, joint swelling, myalgias and neck stiffness.  Skin: Negative for color change, rash and wound.  Neurological: Negative for dizziness, tremors, seizures, speech difficulty, weakness, light-headedness and headaches.  Psychiatric/Behavioral: Negative for behavioral problems, confusion, decreased concentration, dysphoric mood and sleep disturbance. The  patient is not nervous/anxious.   All other systems reviewed and are negative.   Social History      He  reports that he has never smoked. He has never used smokeless tobacco. He reports that he does not drink alcohol or use drugs.       Social History   Socioeconomic History  . Marital status: Married    Spouse name: Not on file  . Number of children: 0  . Years of education: Not on file  . Highest education level: Not on file  Occupational History  . 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  . Financial resource strain: Not on file  . Food insecurity    Worry: Not on file    Inability: Not on file  . Transportation needs    Medical: Not on file    Non-medical: Not on file  Tobacco Use  . Smoking status: Never Smoker  . Smokeless tobacco: Never Used  Substance and Sexual Activity  . Alcohol use: No    Alcohol/week: 0.0 standard drinks  . Drug use: No  . Sexual activity: Not on file  Lifestyle  . Physical activity    Days per week: Not on file    Minutes per session: Not on file  . Stress: Not on file  Relationships  . Social Herbalist on phone: Not on file    Gets together: Not on file    Attends religious service: Not on file    Active member of club or organization: Not on file    Attends meetings of clubs or organizations: Not on file    Relationship status: Not on file  Other Topics Concern  . Not on file  Social History Narrative  . Not on file  Past Medical History:  Diagnosis Date  . History of chicken pox   . History of kidney stones   . Hx of measles   . Kidney stone   . Status post Mohs surgery for basal cell carcinoma 08/21/2013   [facility]     Patient Active Problem List   Diagnosis Date Noted  . Encounter for screening colonoscopy   . OA (osteoarthritis) of knee 05/13/2016  . Hypogonadism in male 05/12/2015  . Arthritis 05/09/2015  . 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  Hematuria 05/09/2015  . Personal history of kidney stones 05/09/2015  . Basal cell carcinoma of ear 08/21/2013    Past Surgical History:  Procedure Laterality Date  . COLONOSCOPY  2009   per pt report was advised to repeat in 10 years  . COLONOSCOPY WITH PROPOFOL N/A 11/14/2018   Procedure: COLONOSCOPY WITH PROPOFOL;  Surgeon: Lucilla Lame, MD;  Location: [facility] ENDOSCOPY;  Service: Endoscopy;  Laterality: N/A;  . EXTRACORPOREAL SHOCK WAVE LITHOTRIPSY Left 04/20/2018   Procedure: LEFT EXTRACORPOREAL SHOCK WAVE LITHOTRIPSY (ESWL);  Surgeon: [name], MD;  Location: [facility] ORS;  Service: Urology;  Laterality: Left;  . MOHS SURGERY  2014   Right Ear  . TONSILLECTOMY      Family History        Family Status  Relation Name Status  . Mother  Deceased at age 9  . Father  Alive  . Sister  Alive  . Brother  Deceased at age 75        His family history includes Diabetes in his mother; Heart disease in his brother, father, and mother; Prostate cancer in his father; Uterine cancer in his sister.      No Known Allergies   Current Outpatient Medications:  .  aspirin 81 MG tablet, Take 81 mg by mouth daily., Disp: , Rfl:  .  CALCIUM PO, Take by mouth., Disp: , Rfl:  .  Multiple Vitamins-Minerals (MULTIVITAMIN PO), Take 1 capsule by mouth daily., Disp: , Rfl:  .  sildenafil (VIAGRA) 100 MG tablet, Take 0.5-1 tablets by mouth. 30 minutes before sexual activity, no more than one in a day, Disp: , Rfl:  .  tamsulosin (FLOMAX) 0.4 MG CAPS capsule, Take 0.4 mg by mouth., Disp: , Rfl:  .  vitamin E 400 UNIT capsule, Take 400 Units by mouth daily., Disp: , Rfl:    Patient Care Team: Birdie Sons, MD as PCP - General (Family Medicine) Dasher, Rayvon Char, MD (Dermatology) [name], MD as Attending Physician (Urology) Bjorn Loser, MD as Consulting Physician (Urology)    Objective:    Vitals: BP 100/60 (BP Location: Left Arm, Patient Position: Sitting, Cuff Size: Large)   Pulse 60   Temp  (!) 96.8 F (36 C) (Temporal)   Resp 16   Ht 6\' 4"  (1.93 m)   Wt 181 lb (82.1 kg)   SpO2 99% Comment: room air  BMI 22.03 kg/m    Vitals:   07/10/19 1001  BP: 100/60  Pulse: 60  Resp: 16  Temp: (!) 96.8 F (36 C)  TempSrc: Temporal  SpO2: 99%  Weight: 181 lb (82.1 kg)  Height: 6\' 4"  (1.93 m)     Physical Exam   General Appearance:    Well developed, well nourished male. Alert, cooperative, in no acute distress, appears stated age  Head:    Normocephalic, without obvious abnormality, atraumatic  Eyes:    PERRL, conjunctiva/corneas clear, EOM's intact, fundi  benign, both eyes       Ears:    Normal TM's and external ear canals, both ears  Nose:   Nares normal, septum midline, mucosa normal, no drainage   or sinus tenderness  Throat:   Lips, mucosa, and tongue normal; teeth and gums normal  Neck:   Supple, symmetrical, trachea midline, no adenopathy;       thyroid:  No enlargement/tenderness/nodules; no carotid   bruit or JVD  Back:     Symmetric, no curvature, ROM normal, no CVA tenderness  Lungs:     Clear to auscultation bilaterally, respirations unlabored  Chest wall:    No tenderness or deformity  Heart:    Normal heart rate. Normal rhythm. No murmurs, rubs, or gallops.  S1 and S2 normal  Abdomen:     Soft, non-tender, bowel sounds active all four quadrants,    no masses, no organomegaly  Genitalia:    deferred  Rectal:    deferred  Extremities:   All extremities are intact. No cyanosis or edema  Pulses:   2+ and symmetric all extremities  Skin:   Skin color, texture, turgor normal, no rashes or lesions  Lymph nodes:   Cervical, supraclavicular, and axillary nodes normal  Neurologic:   CNII-XII intact. Normal strength, sensation and reflexes      throughout    Depression Screen PHQ 2/9 Scores 07/10/2019 05/29/2018 05/19/2017 05/13/2016  PHQ - 2 Score 0 0 0 0  PHQ- 9 Score 0 1 1 0       Assessment & Plan:     Routine Health Maintenance and Physical Exam   Exercise Activities and Dietary recommendations Goals   None     Immunization History  Administered Date(s) Administered  . Influenza,inj,Quad PF,6+ Mos 05/12/2015, 05/13/2016, 05/19/2017, 05/29/2018  . Tdap 03/12/2010  . Zoster Recombinat (Shingrix) 05/19/2017, 07/25/2017    Health Maintenance  Topic Date Due  . HIV Screening  08/27/1973  . INFLUENZA VACCINE  04/14/2019  . TETANUS/TDAP  03/12/2020  . COLONOSCOPY  11/14/2028  . Hepatitis C Screening  Completed     Discussed health benefits of physical activity, and encouraged him to engage in regular exercise appropriate for his age and condition.    --------------------------------------------------------------------  1. Annual physical exam  - Comprehensive metabolic panel - Lipid panel  2. Basal cell carcinoma (BCC) of skin of ear, unspecified laterality Continue routine follow up Dr. Evorn Gong  3. ED (erectile dysfunction) of organic origin Has done well on 20mg  sildenafil tablets, but advised that 50 tabs are now much more affordable. He may have to check with a few pharmacies.  - sildenafil (VIAGRA) 50 MG tablet; Take 1-2 tablets (50-100 mg total) by mouth daily as needed for erectile dysfunction.  Dispense: 30 tablet; Refill: 2  4. Prostate cancer screening  - PSA  5. Need for influenza vaccination  - Flu Vaccine QUAD 6+ mos PF IM (Fluarix Quad PF)   The entirety of the information documented in the History of Present Illness, Review of Systems and Physical Exam were personally obtained by me. Portions of this information were initially documented by Meyer Cory, CMA and reviewed by me for thoroughness and accuracy.    Lelon Huh, MD  [facility]

## 2019-07-11 LAB — COMPREHENSIVE METABOLIC PANEL
ALT: 17 IU/L (ref 0–44)
AST: 25 IU/L (ref 0–40)
Albumin/Globulin Ratio: 2.6 — ABNORMAL HIGH (ref 1.2–2.2)
Albumin: 4.7 g/dL (ref 3.8–4.9)
Alkaline Phosphatase: 51 IU/L (ref 39–117)
BUN/Creatinine Ratio: 9 — ABNORMAL LOW (ref 10–24)
BUN: 9 mg/dL (ref 8–27)
Bilirubin Total: 0.5 mg/dL (ref 0.0–1.2)
CO2: 25 mmol/L (ref 20–29)
Calcium: 9.4 mg/dL (ref 8.6–10.2)
Chloride: 104 mmol/L (ref 96–106)
Creatinine, Ser: 1.05 mg/dL (ref 0.76–1.27)
GFR calc Af Amer: 89 mL/min/{1.73_m2} (ref >59)
GFR calc non Af Amer: 77 mL/min/{1.73_m2} (ref >59)
Globulin, Total: 1.8 g/dL (ref 1.5–4.5)
Glucose: 90 mg/dL (ref 65–99)
Potassium: 4.3 mmol/L (ref 3.5–5.2)
Sodium: 142 mmol/L (ref 134–144)
Total Protein: 6.5 g/dL (ref 6.0–8.5)

## 2019-07-11 LAB — LIPID PANEL
Chol/HDL Ratio: 2.7 ratio (ref 0.0–5.0)
Cholesterol, Total: 181 mg/dL (ref 100–199)
HDL: 67 mg/dL (ref >39)
LDL Chol Calc (NIH): 99 mg/dL (ref 0–99)
Triglycerides: 79 mg/dL (ref 0–149)
VLDL Cholesterol Cal: 15 mg/dL (ref 5–40)

## 2019-07-11 LAB — PSA: Prostate Specific Ag, Serum: 1.9 ng/mL (ref 0.0–4.0)

## 2020-01-11 NOTE — Progress Notes (Signed)
     Established patient visit   Patient: Brian Duncan   DOB: October 25, 1955   64 y.o. Male  MRN: [id number] Visit Date: 01/14/2020  Today's healthcare provider: Lelon Huh, MD   Chief Complaint  Patient presents with  . Arm Pain    3-4 months ago   Subjective    Arm Pain  Incident onset: 3-4  months ago. There was no injury mechanism. The pain is present in the upper right arm (starts in the deltoid muscle and radiates down the right arm). Quality: soreness that progressed into a sharp pain when trying to start a chain saw. The pain has been constant since the incident. Pertinent negatives include no chest pain. Exacerbated by: sleeping  He has tried ice for the symptoms. The treatment provided mild (relief was short term) relief.  Certain movements of the arm would aggravate pain. Patient states the pain wakes him from his sleep at night. Quality of pain varies from achy, sharp to throbbing. Today the pain seems to be improving since onset, especially over the last month.      Medications: Outpatient Medications Prior to Visit  Medication Sig  . aspirin 81 MG tablet Take 81 mg by mouth daily.  Marland Kitchen CALCIUM PO Take by mouth.  . Multiple Vitamins-Minerals (MULTIVITAMIN PO) Take 1 capsule by mouth daily.  . sildenafil (VIAGRA) 50 MG tablet Take 1-2 tablets (50-100 mg total) by mouth daily as needed for erectile dysfunction.  . tamsulosin (FLOMAX) 0.4 MG CAPS capsule Take 0.4 mg by mouth.  . vitamin E 400 UNIT capsule Take 400 Units by mouth daily.   No facility-administered medications prior to visit.    Review of Systems  Constitutional: Negative.  Negative for appetite change, chills and fever.  Respiratory: Negative.  Negative for chest tightness, shortness of breath and wheezing.   Cardiovascular: Negative.  Negative for chest pain and palpitations.  Gastrointestinal: Negative for abdominal pain, nausea and vomiting.  Musculoskeletal: Positive for myalgias (right arm pain).        Objective    Temp (!) 96.9 F (36.1 C) (Temporal)   Resp 16   Wt 184 lb (83.5 kg)   BMI 22.40 kg/m    Physical Exam  Slight tenderness over right lateral acromium down into right deltoid. FROM of shoulder. No swelling or other gross deformities.   No results found for any visits on 01/14/20.  Assessment & Plan     1. Strain of right deltoid muscle, initial encounter Has been present for 3-4 months, but has greatly improved over recent weeks. Discussed application of heat or ice if there is a re-injury. Discussed options of PT referral if not continuing to steadily improve. Discussed role of occasional NSAIDs when he has pain in the shoulder over night.   No follow-ups on file.      The entirety of the information documented in the History of Present Illness, Review of Systems and Physical Exam were personally obtained by me. Portions of this information were initially documented by the CMA and reviewed by me for thoroughness and accuracy.      Lelon Huh, MD  [facility] [phone] (phone) [phone] (fax)  [place]

## 2020-01-14 ENCOUNTER — Other Ambulatory Visit: Payer: Self-pay

## 2020-01-14 ENCOUNTER — Ambulatory Visit (INDEPENDENT_AMBULATORY_CARE_PROVIDER_SITE_OTHER): Payer: BC Managed Care – PPO | Admitting: Family Medicine

## 2020-01-14 ENCOUNTER — Encounter: Payer: Self-pay | Admitting: Family Medicine

## 2020-01-14 VITALS — BP 110/60 | HR 67 | Temp 96.9°F | Resp 16 | Wt 184.0 lb

## 2020-01-14 DIAGNOSIS — S46811A Strain of other muscles, fascia and tendons at shoulder and upper arm level, right arm, initial encounter: Secondary | ICD-10-CM

## 2020-01-14 NOTE — Patient Instructions (Signed)
.   Please review the attached list of medications and notify my office if there are any errors.   Marland Kitchen Apply heat pad to side of shoulder for 8-10 minutes at least once a day   If the shoulder pain gets worse again, or does not continue to improve then call back for referral to physical therapy.   You can take an OTC NSAID such as ibuprofen or naproxen before going to bed at night to reduce nighttime pain and inflammation

## 2020-08-15 ENCOUNTER — Encounter: Payer: Self-pay | Admitting: Family Medicine

## 2020-08-15 ENCOUNTER — Ambulatory Visit (INDEPENDENT_AMBULATORY_CARE_PROVIDER_SITE_OTHER): Payer: BC Managed Care – PPO | Admitting: Family Medicine

## 2020-08-15 ENCOUNTER — Other Ambulatory Visit: Payer: Self-pay

## 2020-08-15 VITALS — BP 107/65 | HR 64 | Resp 16 | Ht 76.0 in | Wt 186.0 lb

## 2020-08-15 DIAGNOSIS — Z125 Encounter for screening for malignant neoplasm of prostate: Secondary | ICD-10-CM

## 2020-08-15 DIAGNOSIS — Z23 Encounter for immunization: Secondary | ICD-10-CM | POA: Diagnosis not present

## 2020-08-15 DIAGNOSIS — Z0000 Encounter for general adult medical examination without abnormal findings: Secondary | ICD-10-CM

## 2020-08-15 NOTE — Progress Notes (Signed)
Complete physical exam   Patient: Brian Duncan   DOB: 02-Jul-1956   64 y.o. Male  MRN: [id number] Visit Date: 08/15/2020  Today's healthcare provider: Lelon Huh, MD   Chief Complaint  Patient presents with  . Annual Exam  . Erectile Dysfunction  . Basal Cell Carcinoma   Subjective    Brian Duncan is a 64 y.o. male who presents today for a complete physical exam.  He reports consuming a general diet.  He generally feels well. He reports sleeping well. He does not have additional problems to discuss today.  HPI  Follow up for Erectile Dysfunction:  The patient was last seen for this 1 year ago. Changes made at last visit include giving patient a prescription for sildenafil (VIAGRA) 50 MG tablet.  He reports good compliance with treatment. He feels that condition is Unchanged. He is not having side effects.   Follow up for Basal cell carcinoma (BCC) of skin of ear  The patient was last seen for this 1 year ago. Changes made at last visit include none; continue routine follow up with Dr. Evorn Gong.  He reports good compliance with treatment. He feels that condition is Unchanged. He is not having side effects.    Past Medical History:  Diagnosis Date  . History of chicken pox   . History of kidney stones   . Hx of measles   . Kidney stone   . Status post Mohs surgery for basal cell carcinoma 08/21/2013   [facility]   Past Surgical History:  Procedure Laterality Date  . COLONOSCOPY  2009   per pt report was advised to repeat in 10 years  . COLONOSCOPY WITH PROPOFOL N/A 11/14/2018   Procedure: COLONOSCOPY WITH PROPOFOL;  Surgeon: Lucilla Lame, MD;  Location[facility] ENDOSCOPY;  Service: Endoscopy;  Laterality: N/A;  . EXTRACORPOREAL SHOCK WAVE LITHOTRIPSY Left 04/20/2018   Procedure: LEFT EXTRACORPOREAL SHOCK WAVE LITHOTRIPSY (ESWL);  Surgeon: [name], MD;  Location: [facility] ORS;  Service: Urology;  Laterality: Left;  . MOHS SURGERY  2014   Right Ear  . TONSILLECTOMY      Social History   Socioeconomic History  . Marital status: Married    Spouse name: Not on file  . Number of children: 0  . Years of education: Not on file  . Highest education level: Not on file  Occupational History  . Occupation: Retired    Comment: Former Chief Financial Officer for [employer]  . Smoking status: Never Smoker  . Smokeless tobacco: Never Used  Vaping Use  . Vaping Use: Never used  Substance and Sexual Activity  . Alcohol use: No    Alcohol/week: 0.0 standard drinks  . Drug use: No  . Sexual activity: Not on file  Other Topics Concern  . Not on file  Social History Narrative  . Not on file   Social Determinants of Health   Financial Resource Strain:   . Difficulty of Paying Living Expenses: Not on file  Food Insecurity:   . Worried About Charity fundraiser in the Last Year: Not on file  . Ran Out of Food in the Last Year: Not on file  Transportation Needs:   . Lack of Transportation (Medical): Not on file  . Lack of Transportation (Non-Medical): Not on file  Physical Activity:   . Days of Exercise per Week: Not on file  . Minutes of Exercise per Session: Not on file  Stress:   . Feeling of Stress :  Not on file  Social Connections:   . Frequency of Communication with Friends and Family: Not on file  . Frequency of Social Gatherings with Friends and Family: Not on file  . Attends Religious Services: Not on file  . Active Member of Clubs or Organizations: Not on file  . Attends Archivist Meetings: Not on file  . Marital Status: Not on file  Intimate Partner Violence:   . Fear of Current or Ex-Partner: Not on file  . Emotionally Abused: Not on file  . Physically Abused: Not on file  . Sexually Abused: Not on file   Family Status  Relation Name Status  . Mother  Deceased at age 49  . Father  Alive  . Sister  Alive  . Brother  Deceased at age 33   Family History  Problem Relation Age of Onset  . Diabetes Mother   . Heart disease  Mother   . Heart disease Father   . Prostate cancer Father   . Uterine cancer Sister   . Heart disease Brother    No Known Allergies  Patient Care Team: Birdie Sons, MD as PCP - General (Family Medicine) Dasher, Rayvon Char, MD (Dermatology) [name], MD as Attending Physician (Urology) Bjorn Loser, MD as Consulting Physician (Urology) Anell Barr, OD (Optometry) Dasher, Rayvon Char, MD (Dermatology)   Medications: Outpatient Medications Prior to Visit  Medication Sig  . aspirin 81 MG tablet Take 81 mg by mouth daily.  Marland Kitchen CALCIUM PO Take by mouth.  . Multiple Vitamins-Minerals (MULTIVITAMIN PO) Take 1 capsule by mouth daily.  . sildenafil (VIAGRA) 50 MG tablet Take 1-2 tablets (50-100 mg total) by mouth daily as needed for erectile dysfunction.  . tamsulosin (FLOMAX) 0.4 MG CAPS capsule Take 0.4 mg by mouth.  . vitamin E 400 UNIT capsule Take 400 Units by mouth daily.   No facility-administered medications prior to visit.    Review of Systems  All other systems reviewed and are negative.     Objective    BP 107/65   Pulse 64   Resp 16   Ht 6\' 4"  (1.93 m)   Wt 186 lb (84.4 kg)   BMI 22.64 kg/m    Physical Exam   General Appearance:    Well developed, well nourished male. Alert, cooperative, in no acute distress, appears stated age  Head:    Normocephalic, without obvious abnormality, atraumatic  Eyes:    PERRL, conjunctiva/corneas clear, EOM's intact, fundi    benign, both eyes       Ears:    Normal TM's and external ear canals, both ears  Nose:   Nares normal, septum midline, mucosa normal, no drainage   or sinus tenderness  Throat:   Lips, mucosa, and tongue normal; teeth and gums normal  Neck:   Supple, symmetrical, trachea midline, no adenopathy;       thyroid:  No enlargement/tenderness/nodules; no carotid   bruit or JVD  Back:     Symmetric, no curvature, ROM normal, no CVA tenderness  Lungs:     Clear to auscultation bilaterally, respirations  unlabored  Chest wall:    No tenderness or deformity  Heart:    Normal heart rate. Normal rhythm. No murmurs, rubs, or gallops.  S1 and S2 normal  Abdomen:     Soft, non-tender, bowel sounds active all four quadrants,    no masses, no organomegaly  Genitalia:    deferred  Rectal:    deferred  Extremities:  All extremities are intact. No cyanosis or edema  Pulses:   2+ and symmetric all extremities  Skin:   Skin color, texture, turgor normal, no rashes or lesions  Lymph nodes:   Cervical, supraclavicular, and axillary nodes normal  Neurologic:   CNII-XII intact. Normal strength, sensation and reflexes      throughout     Last depression screening scores PHQ 2/9 Scores 08/15/2020 07/10/2019 05/29/2018  PHQ - 2 Score 0 0 0  PHQ- 9 Score 0 0 1   Last fall risk screening Fall Risk  08/15/2020  Falls in the past year? 0  Number falls in past yr: 0  Injury with Fall? 0  Risk for fall due to : No Fall Risks  Follow up Falls evaluation completed   Last Audit-C alcohol use screening Alcohol Use Disorder Test (AUDIT) 08/15/2020  1. How often do you have a drink containing alcohol? 0  2. How many drinks containing alcohol do you have on a typical day when you are drinking? 0  3. How often do you have six or more drinks on one occasion? 0  AUDIT-C Score 0  Alcohol Brief Interventions/Follow-up AUDIT Score <7 follow-up not indicated   A score of 3 or more in women, and 4 or more in men indicates increased risk for alcohol abuse, EXCEPT if all of the points are from question 1   No results found for any visits on 08/15/20.  Assessment & Plan    Routine Health Maintenance and Physical Exam  Exercise Activities and Dietary recommendations Goals   None     Immunization History  Administered Date(s) Administered  . Influenza,inj,Quad PF,6+ Mos 05/12/2015, 05/13/2016, 05/19/2017, 05/29/2018, 07/10/2019  . Influenza-Unspecified 07/15/2020  . Tdap 03/12/2010  . Zoster Recombinat  (Shingrix) 05/19/2017, 07/25/2017    Health Maintenance  Topic Date Due  . COVID-19 Vaccine (1) Never done  . HIV Screening  Never done  . TETANUS/TDAP  03/12/2020  . COLONOSCOPY  11/14/2028  . INFLUENZA VACCINE  Completed  . Hepatitis C Screening  Completed    Discussed health benefits of physical activity, and encouraged him to engage in regular exercise appropriate for his age and condition.  1. Annual physical exam  - Comprehensive metabolic panel - Lipid panel - CBC  2. Need for vaccine for Td (tetanus-diphtheria)  - Td : Tetanus/diphtheria >7yo Preservative  free  3. Prostate cancer screening  - PSA Total (Reflex To Free) ([facility] only)       The entirety of the information documented in the History of Present Illness, Review of Systems and Physical Exam were personally obtained by me. Portions of this information were initially documented by the CMA and reviewed by me for thoroughness and accuracy.      Lelon Huh, MD  [facility] [phone] (phone) [phone] (fax)  [place]

## 2020-08-16 LAB — COMPREHENSIVE METABOLIC PANEL
ALT: 18 IU/L (ref 0–44)
AST: 26 IU/L (ref 0–40)
Albumin/Globulin Ratio: 2.3 — ABNORMAL HIGH (ref 1.2–2.2)
Albumin: 4.5 g/dL (ref 3.8–4.8)
Alkaline Phosphatase: 48 IU/L (ref 44–121)
BUN/Creatinine Ratio: 7 — ABNORMAL LOW (ref 10–24)
BUN: 7 mg/dL — ABNORMAL LOW (ref 8–27)
Bilirubin Total: 0.7 mg/dL (ref 0.0–1.2)
CO2: 24 mmol/L (ref 20–29)
Calcium: 9.3 mg/dL (ref 8.6–10.2)
Chloride: 103 mmol/L (ref 96–106)
Creatinine, Ser: 1 mg/dL (ref 0.76–1.27)
GFR calc Af Amer: 93 mL/min/{1.73_m2} (ref >59)
GFR calc non Af Amer: 81 mL/min/{1.73_m2} (ref >59)
Globulin, Total: 2 g/dL (ref 1.5–4.5)
Glucose: 87 mg/dL (ref 65–99)
Potassium: 4.4 mmol/L (ref 3.5–5.2)
Sodium: 140 mmol/L (ref 134–144)
Total Protein: 6.5 g/dL (ref 6.0–8.5)

## 2020-08-16 LAB — PSA TOTAL (REFLEX TO FREE): Prostate Specific Ag, Serum: 1.8 ng/mL (ref 0.0–4.0)

## 2020-08-16 LAB — LIPID PANEL
Chol/HDL Ratio: 2.7 ratio (ref 0.0–5.0)
Cholesterol, Total: 189 mg/dL (ref 100–199)
HDL: 69 mg/dL (ref >39)
LDL Chol Calc (NIH): 106 mg/dL — ABNORMAL HIGH (ref 0–99)
Triglycerides: 76 mg/dL (ref 0–149)
VLDL Cholesterol Cal: 14 mg/dL (ref 5–40)

## 2020-08-16 LAB — CBC
Hematocrit: 43.9 % (ref 37.5–51.0)
Hemoglobin: 14.3 g/dL (ref 13.0–17.7)
MCH: 32.3 pg (ref 26.6–33.0)
MCHC: 32.6 g/dL (ref 31.5–35.7)
MCV: 99 fL — ABNORMAL HIGH (ref 79–97)
Platelets: 205 10*3/uL (ref 150–450)
RBC: 4.43 x10E6/uL (ref 4.14–5.80)
RDW: 12.2 % (ref 11.6–15.4)
WBC: 4.9 10*3/uL (ref 3.4–10.8)

## 2020-11-17 ENCOUNTER — Other Ambulatory Visit: Payer: Self-pay | Admitting: Family Medicine

## 2020-11-17 DIAGNOSIS — N529 Male erectile dysfunction, unspecified: Secondary | ICD-10-CM

## 2020-11-17 NOTE — Telephone Encounter (Signed)
Requested Prescriptions  Pending Prescriptions Disp Refills  . sildenafil (VIAGRA) 50 MG tablet [Pharmacy Med Name: SILDENAFIL 50 MG TABLET] 30 tablet 0    Sig: Take 1-2 tablets (50-100 mg total) by mouth daily as needed for erecti le dysfunction.     Urology: Erectile Dysfunction Agents Passed - 11/17/2020  9:21 AM      Passed - Last BP in normal range    BP Readings from Last 1 Encounters:  08/15/20 107/65         Passed - Valid encounter within last 12 months    Recent Outpatient Visits          3 months ago Annual physical exam   [facility] Birdie Sons, MD   10 months ago Strain of right deltoid muscle, initial encounter   [facility] Birdie Sons, MD   1 year ago Annual physical exam   [facility] Birdie Sons, MD   2 years ago Annual physical exam   [facility] Birdie Sons, MD   3 years ago Annual physical exam   [facility] Birdie Sons, MD      Future Appointments            In 9 months Fisher, Kirstie Peri, MD [facility] [address], [place]

## 2021-07-28 ENCOUNTER — Other Ambulatory Visit: Payer: Self-pay | Admitting: Family Medicine

## 2021-07-28 DIAGNOSIS — N529 Male erectile dysfunction, unspecified: Secondary | ICD-10-CM

## 2021-08-21 ENCOUNTER — Ambulatory Visit (INDEPENDENT_AMBULATORY_CARE_PROVIDER_SITE_OTHER): Payer: BC Managed Care – PPO | Admitting: Family Medicine

## 2021-08-21 ENCOUNTER — Other Ambulatory Visit: Payer: Self-pay

## 2021-08-21 ENCOUNTER — Encounter: Payer: Self-pay | Admitting: Family Medicine

## 2021-08-21 VITALS — BP 111/70 | HR 73 | Temp 98.4°F | Ht 76.0 in | Wt 180.6 lb

## 2021-08-21 DIAGNOSIS — Z23 Encounter for immunization: Secondary | ICD-10-CM | POA: Diagnosis not present

## 2021-08-21 DIAGNOSIS — M1712 Unilateral primary osteoarthritis, left knee: Secondary | ICD-10-CM

## 2021-08-21 DIAGNOSIS — Z125 Encounter for screening for malignant neoplasm of prostate: Secondary | ICD-10-CM | POA: Diagnosis not present

## 2021-08-21 DIAGNOSIS — N529 Male erectile dysfunction, unspecified: Secondary | ICD-10-CM

## 2021-08-21 DIAGNOSIS — Z0000 Encounter for general adult medical examination without abnormal findings: Secondary | ICD-10-CM | POA: Diagnosis not present

## 2021-08-21 NOTE — Progress Notes (Signed)
Complete physical exam   Patient: Brian Duncan   DOB: [birth year]   65 y.o. Male  MRN: [id number] Visit Date: 08/21/2021  Today's healthcare provider: Lelon Huh, MD   Chief Complaint  Patient presents with   Annual Exam   Erectile Dysfunction   Subjective    Brian Duncan is a 65 y.o. male who presents today for a complete physical exam.  He reports consuming a general diet.  Reports walking 3 times a week for about 20-25 minutes.  He generally feels well. He reports sleeping well. He does have additional problems to discuss today.  HPI  -Would like to receive Flu vaccine -Would like to see if lipid and testosterone could be included in blood work Follow up for Erectile dysfunction:  The patient was last seen for this 1  year  ago. Changes made at last visit include none.  He reports good compliance with treatment. He feels that condition is  slowly getting worse . He is having side effects. Sometimes it will give him a mild headache.  -----------------------------------------------------------------------------------------   Past Medical History:  Diagnosis Date   History of chicken pox    History of kidney stones    Hx of measles    Kidney stone    Status post Mohs surgery for basal cell carcinoma 08/21/2013   [facility]   Past Surgical History:  Procedure Laterality Date   COLONOSCOPY  2009   per pt report was advised to repeat in 10 years   COLONOSCOPY WITH PROPOFOL N/A 11/14/2018   Procedure: COLONOSCOPY WITH PROPOFOL;  Surgeon: Lucilla Lame, MD;  Location: [facility];  Service: Endoscopy;  Laterality: N/A;   EXTRACORPOREAL SHOCK WAVE LITHOTRIPSY Left 04/20/2018   Procedure: LEFT EXTRACORPOREAL SHOCK WAVE LITHOTRIPSY (ESWL);  Surgeon: [name], MD;  Location: [facility];  Service: Urology;  Laterality: Left;   MOHS SURGERY  2014   Right Ear   TONSILLECTOMY     Social History   Socioeconomic History   Marital status: Married    Spouse name: Not on file    Number of children: 0   Years of education: Not on file   Highest education level: Not on file  Occupational History   Occupation: Retired    Comment: Former Chief Financial Officer for [employer] Use   Smoking status: Never   Smokeless tobacco: Never  Scientific laboratory technician Use: Never used  Substance and Sexual Activity   Alcohol use: No    Alcohol/week: 0.0 standard drinks   Drug use: No   Sexual activity: Not on file  Other Topics Concern   Not on file  Social History Narrative   Not on file   Social Determinants of Health   Financial Resource Strain: Not on file  Food Insecurity: Not on file  Transportation Needs: Not on file  Physical Activity: Not on file  Stress: Not on file  Social Connections: Not on file  Intimate Partner Violence: Not on file   Family Status  Relation Name Status   Mother  Deceased at age 5   Father  38   Sister  9   Brother  Deceased at age 7   Family History  Problem Relation Age of Onset   Diabetes Mother    Heart disease Mother    Heart disease Father    Prostate cancer Father    Uterine cancer Sister    Heart disease Brother    No Known Allergies  Patient Care  Team: Birdie Sons, MD as PCP - General (Family Medicine) Dasher, Rayvon Char, MD (Dermatology) [name], MD as Attending Physician (Urology) Bjorn Loser, MD as Consulting Physician (Urology) Anell Barr, OD (Optometry) Dasher, Rayvon Char, MD (Dermatology)   Medications: Outpatient Medications Prior to Visit  Medication Sig   aspirin 81 MG tablet Take 81 mg by mouth daily.   CALCIUM PO Take by mouth.   Cholecalciferol (D3 PO) Take by mouth.   Cyanocobalamin (B-12 PO) Take by mouth.   Multiple Vitamins-Minerals (MULTIVITAMIN PO) Take 1 capsule by mouth daily.   Omega-3 Fatty Acids (OMEGA-3 PO) Take by mouth.   sildenafil (VIAGRA) 50 MG tablet Take 1-2 tablets (50-100 mg total) by mouth daily as needed for erecti le dysfunction.   tamsulosin (FLOMAX) 0.4 MG  CAPS capsule Take 0.4 mg by mouth.   vitamin E 400 UNIT capsule Take 400 Units by mouth daily.   No facility-administered medications prior to visit.    Review of Systems  HENT:  Positive for tinnitus.   Genitourinary:  Positive for frequency and urgency.  All other systems reviewed and are negative.    Objective    BP 111/70 (BP Location: Right Arm, Patient Position: Sitting, Cuff Size: Normal)   Pulse 73   Temp 98.4 F (36.9 C) (Oral)   Ht 6\' 4"  (1.93 m)   Wt 180 lb 9.6 oz (81.9 kg)   SpO2 97%   BMI 21.98 kg/m    Physical Exam    General Appearance:    Well developed, well nourished male. Alert, cooperative, in no acute distress, appears stated age  Head:    Normocephalic, without obvious abnormality, atraumatic  Eyes:    PERRL, conjunctiva/corneas clear, EOM's intact, fundi    benign, both eyes       Ears:    Normal TM's and external ear canals, both ears  Neck:   Supple, symmetrical, trachea midline, no adenopathy;       thyroid:  No enlargement/tenderness/nodules; no carotid   bruit or JVD  Back:     Symmetric, no curvature, ROM normal, no CVA tenderness  Lungs:     Clear to auscultation bilaterally, respirations unlabored  Chest wall:    No tenderness or deformity  Heart:    Normal heart rate. Normal rhythm. No murmurs, rubs, or gallops.  S1 and S2 normal  Abdomen:     Soft, non-tender, bowel sounds active all four quadrants,    no masses, no organomegaly  Genitalia:    deferred  Rectal:    deferred  Extremities:   All extremities are intact. No cyanosis or edema  Pulses:   2+ and symmetric all extremities  Skin:   Skin color, texture, turgor normal, no rashes or lesions  Lymph nodes:   Cervical, supraclavicular, and axillary nodes normal  Neurologic:   CNII-XII intact. Normal strength, sensation and reflexes      throughout     Last depression screening scores PHQ 2/9 Scores 08/21/2021 08/15/2020 07/10/2019  PHQ - 2 Score 0 0 0  PHQ- 9 Score 0 0 0   Last  fall risk screening Fall Risk  08/21/2021  Falls in the past year? 0  Number falls in past yr: 0  Injury with Fall? 0  Risk for fall due to : No Fall Risks  Follow up -   Last Audit-C alcohol use screening Alcohol Use Disorder Test (AUDIT) 08/21/2021  1. How often do you have a drink containing alcohol? 0  2.  How many drinks containing alcohol do you have on a typical day when you are drinking? 0  3. How often do you have six or more drinks on one occasion? 0  AUDIT-C Score 0  Alcohol Brief Interventions/Follow-up -   A score of 3 or more in women, and 4 or more in men indicates increased risk for alcohol abuse, EXCEPT if all of the points are from question 1   No results found for any visits on 08/21/21.  Assessment & Plan    Routine Health Maintenance and Physical Exam  Exercise Activities and Dietary recommendations  Goals   None     Immunization History  Administered Date(s) Administered   Influenza,inj,Quad PF,6+ Mos 05/12/2015, 05/13/2016, 05/19/2017, 05/29/2018, 07/10/2019   Influenza-Unspecified 07/22/2020   Moderna Sars-Covid-2 Vaccination 12/06/2019, 01/03/2020, 07/22/2020   Td 08/15/2020   Tdap 03/12/2010   Zoster Recombinat (Shingrix) 05/19/2017, 07/25/2017    Health Maintenance  Topic Date Due   Pneumococcal Vaccine 35-72 Years old (1 - PCV) Never done   HIV Screening  Never done   COVID-19 Vaccine (4 - Booster for Moderna series) 09/16/2020   INFLUENZA VACCINE  04/13/2021   COLONOSCOPY (Pts 45-76yrs Insurance coverage will need to be confirmed)  11/14/2028   TETANUS/TDAP  08/15/2030   Hepatitis C Screening  Completed   Zoster Vaccines- Shingrix  Completed   HPV VACCINES  Aged Out    Discussed health benefits of physical activity, and encouraged him to engage in regular exercise appropriate for his age and condition.  1. ED (erectile dysfunction) of organic origin  - Testosterone,Free and Total  2. Osteoarthritis of left knee, unspecified  osteoarthritis type   3. Prostate cancer screening  - PSA Total (Reflex To Free) ([facility] only)  4. Need for influenza vaccination  - Flu Vaccine QUAD 36+ mos IM (Fluarix/Fluzone)      The entirety of the information documented in the History of Present Illness, Review of Systems and Physical Exam were personally obtained by me. Portions of this information were initially documented by the CMA and reviewed by me for thoroughness and accuracy.     Lelon Huh, MD  [facility] [phone] (phone) [phone] (fax)  [place]

## 2021-08-21 NOTE — Patient Instructions (Addendum)
Please review the attached list of medications and notify my office if there are any errors.   Please bring all of your medications to every appointment so we can make sure that our medication list is the same as yours.   You can take up to 2,000 units vitamin D3 once a day  I strongly recommend a Covid bivalent (omicron) booster if you have not yet had one

## 2021-08-23 LAB — COMPREHENSIVE METABOLIC PANEL
ALT: 20 IU/L (ref 0–44)
AST: 26 IU/L (ref 0–40)
Albumin/Globulin Ratio: 2.2 (ref 1.2–2.2)
Albumin: 4.4 g/dL (ref 3.8–4.8)
Alkaline Phosphatase: 50 IU/L (ref 44–121)
BUN/Creatinine Ratio: 8 — ABNORMAL LOW (ref 10–24)
BUN: 8 mg/dL (ref 8–27)
Bilirubin Total: 0.6 mg/dL (ref 0.0–1.2)
CO2: 25 mmol/L (ref 20–29)
Calcium: 9.3 mg/dL (ref 8.6–10.2)
Chloride: 101 mmol/L (ref 96–106)
Creatinine, Ser: 1 mg/dL (ref 0.76–1.27)
Globulin, Total: 2 g/dL (ref 1.5–4.5)
Glucose: 91 mg/dL (ref 70–99)
Potassium: 4.2 mmol/L (ref 3.5–5.2)
Sodium: 137 mmol/L (ref 134–144)
Total Protein: 6.4 g/dL (ref 6.0–8.5)
eGFR: 85 mL/min/{1.73_m2} (ref >59)

## 2021-08-23 LAB — LIPID PANEL
Chol/HDL Ratio: 2.7 ratio (ref 0.0–5.0)
Cholesterol, Total: 178 mg/dL (ref 100–199)
HDL: 66 mg/dL (ref >39)
LDL Chol Calc (NIH): 100 mg/dL — ABNORMAL HIGH (ref 0–99)
Triglycerides: 64 mg/dL (ref 0–149)
VLDL Cholesterol Cal: 12 mg/dL (ref 5–40)

## 2021-08-23 LAB — PSA TOTAL (REFLEX TO FREE): Prostate Specific Ag, Serum: 1.6 ng/mL (ref 0.0–4.0)

## 2021-08-23 LAB — TESTOSTERONE,FREE AND TOTAL
Testosterone, Free: 7 pg/mL (ref 6.6–18.1)
Testosterone: 936 ng/dL — ABNORMAL HIGH (ref 264–916)

## 2022-08-20 NOTE — Progress Notes (Unsigned)
I,April Miller,acting as a scribe for Lelon Huh, MD.,have documented all relevant documentation on the behalf of Lelon Huh, MD,as directed by  Lelon Huh, MD while in the presence of Lelon Huh, MD.   Complete physical exam   Patient: Brian Duncan   DOB: 06-Mar-1956   66 y.o. Male  MRN: [id number] Visit Date: 08/23/2022  Today's healthcare provider: Lelon Huh, MD   Chief Complaint  Patient presents with   Annual Exam   Subjective    Brian Duncan is a 66 y.o. male who presents today for a complete physical exam.  He reports consuming a general diet. Home exercise routine includes 2-3 times a week. He generally feels well. He reports sleeping well. He does have additional problems to discuss today.   He states he continues having trouble with ED. Only tolerates '25mg'$  sildenafil due to headaches, but that dose is not effective. He is also noted to have history of BPH with some improvement on tamsulosin, but continues to have some frequency and nocturia. He did have low free testosterone in the past.    He also report episodes of feeling light head with near visual black outs when standing quickly or going from position of leaning over being upright. He has been prone to this for years but has become more frequent the last several months.   Past Medical History:  Diagnosis Date   History of chicken pox    History of kidney stones    Hx of measles    Kidney stone    Status post Mohs surgery for basal cell carcinoma 08/21/2013   [facility]   Past Surgical History:  Procedure Laterality Date   COLONOSCOPY  2009   per pt report was advised to repeat in 10 years   COLONOSCOPY WITH PROPOFOL N/A 11/14/2018   Procedure: COLONOSCOPY WITH PROPOFOL;  Surgeon: Lucilla Lame, MD;  Location: [facility] ENDOSCOPY;  Service: Endoscopy;  Laterality: N/A;   EXTRACORPOREAL SHOCK WAVE LITHOTRIPSY Left 04/20/2018   Procedure: LEFT EXTRACORPOREAL SHOCK WAVE LITHOTRIPSY (ESWL);  Surgeon: [name], MD;  Location: [facility];  Service: Urology;  Laterality: Left;   MOHS SURGERY  2014   Right Ear   TONSILLECTOMY     Social History   Socioeconomic History   Marital status: Married    Spouse name: Not on file   Number of children: 0   Years of education: Not on file   Highest education level: Not on file  Occupational History   Occupation: Retired    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   Smoking status: Never   Smokeless tobacco: Never  Scientific laboratory technician Use: Never used  Substance and Sexual Activity   Alcohol use: No    Alcohol/week: 0.0 standard drinks of alcohol   Drug use: No   Sexual activity: Not on file  Other Topics Concern   Not on file  Social History Narrative   Not on file   Social Determinants of Health   Financial Resource Strain: Not on file  Food Insecurity: Not on file  Transportation Needs: Not on file  Physical Activity: Not on file  Stress: Not on file  Social Connections: Not on file  Intimate Partner Violence: Not on file   Family Status  Relation Name Status   Mother  Deceased at age 41   Father  68   Sister  Alive   Brother  Deceased at age 22   Family History  Problem Relation Age of Onset   Diabetes Mother    Heart disease Mother    Heart disease Father    Prostate cancer Father    Uterine cancer Sister    Heart disease Brother    No Known Allergies  Patient Care Team: Birdie Sons, MD as PCP - General (Family Medicine) Dasher, Rayvon Char, MD (Dermatology) [name], MD as Attending Physician (Urology) Bjorn Loser, MD as Consulting Physician (Urology) Anell Barr, OD (Optometry) Dasher, Rayvon Char, MD (Dermatology)   Medications: Outpatient Medications Prior to Visit  Medication Sig   aspirin 81 MG tablet Take 81 mg by mouth daily.   CALCIUM PO Take by mouth.   Cholecalciferol (D3 PO) Take by mouth.   Cyanocobalamin (B-12 PO) Take by mouth.   Multiple Vitamins-Minerals (MULTIVITAMIN PO)  Take 1 capsule by mouth daily.   Omega-3 Fatty Acids (OMEGA-3 PO) Take by mouth.   sildenafil (VIAGRA) 50 MG tablet Take 1-2 tablets (50-100 mg total) by mouth daily as needed for erecti le dysfunction.   tamsulosin (FLOMAX) 0.4 MG CAPS capsule Take 0.4 mg by mouth.   vitamin E 400 UNIT capsule Take 400 Units by mouth daily.   No facility-administered medications prior to visit.    Review of Systems  HENT:  Positive for tinnitus.   Neurological:  Positive for light-headedness.  All other systems reviewed and are negative.     Objective    BP 116/62 (BP Location: Right Arm, Patient Position: Sitting, Cuff Size: Normal)   Pulse 63   Resp 16   Ht '6\' 4"'$  (1.93 m)   Wt 179 lb (81.2 kg)   SpO2 100%   BMI 21.79 kg/m     Physical Exam   General Appearance:    Well developed, well nourished male. Alert, cooperative, in no acute distress, appears stated age  Head:    Normocephalic, without obvious abnormality, atraumatic  Eyes:    PERRL, conjunctiva/corneas clear, EOM's intact, fundi    benign, both eyes       Ears:    Normal TM's and external ear canals, both ears  Nose:   Nares normal, septum midline, mucosa normal, no drainage   or sinus tenderness  Throat:   Lips, mucosa, and tongue normal; teeth and gums normal  Neck:   Supple, symmetrical, trachea midline, no adenopathy;       thyroid:  No enlargement/tenderness/nodules; no carotid   bruit or JVD  Back:     Symmetric, no curvature, ROM normal, no CVA tenderness  Lungs:     Clear to auscultation bilaterally, respirations unlabored  Chest wall:    No tenderness or deformity  Heart:    Normal heart rate. Normal rhythm. No murmurs, rubs, or gallops.  S1 and S2 normal  Abdomen:     Soft, non-tender, bowel sounds active all four quadrants,    no masses, no organomegaly  Genitalia:    deferred  Rectal:    deferred  Extremities:   All extremities are intact. No cyanosis or edema  Pulses:   2+ and symmetric all extremities   Skin:   Skin color, texture, turgor normal, no rashes or lesions  Lymph nodes:   Cervical, supraclavicular, and axillary nodes normal  Neurologic:   CNII-XII intact. Normal strength, sensation and reflexes      throughout     Last depression screening scores    08/23/2022   10:54 AM 08/21/2021   10:16 AM 08/15/2020   10:21 AM  PHQ  2/9 Scores  PHQ - 2 Score 0 0 0  PHQ- 9 Score 0 0 0   Last fall risk screening    08/23/2022   10:54 AM  [place] in the past year? 1  Number falls in past yr: 0  Injury with Fall? 0  Risk for fall due to : No Fall Risks  Follow up Falls evaluation completed   Last Audit-C alcohol use screening    08/23/2022   10:54 AM  Alcohol Use Disorder Test (AUDIT)  1. How often do you have a drink containing alcohol? 0  2. How many drinks containing alcohol do you have on a typical day when you are drinking? 0  3. How often do you have six or more drinks on one occasion? 0  AUDIT-C Score 0   A score of 3 or more in women, and 4 or more in men indicates increased risk for alcohol abuse, EXCEPT if all of the points are from question 1   EKG: Sinus bradycardia Possible LAE. Incomplete RBBB. Nonspecific ST abnormality.   Assessment & Plan    Routine Health Maintenance and Physical Exam  Exercise Activities and Dietary recommendations  Goals   None     Immunization History  Administered Date(s) Administered   Influenza,inj,Quad PF,6+ Mos 05/12/2015, 05/13/2016, 05/19/2017, 05/29/2018, 07/10/2019, 08/21/2021   Influenza-Unspecified 07/22/2020   Moderna Sars-Covid-2 Vaccination 12/06/2019, 01/03/2020, 07/22/2020   Td 08/15/2020   Tdap 03/12/2010   Zoster Recombinat (Shingrix) 05/19/2017, 07/25/2017    Health Maintenance  Topic Date Due   HIV Screening  Never done   INFLUENZA VACCINE  04/13/2022   COVID-19 Vaccine (4 - 2023-24 season) 05/14/2022   COLONOSCOPY (Pts 45-97yr Insurance coverage will need to be confirmed)  11/14/2028    DTaP/Tdap/Td (3 - Td or Tdap) 08/15/2030   Hepatitis C Screening  Completed   Zoster Vaccines- Shingrix  Completed   HPV VACCINES  Aged Out    Discussed health benefits of physical activity, and encouraged him to engage in regular exercise appropriate for his age and condition.  1. Annual physical exam  - CBC - Lipid panel - Comprehensive metabolic panel  2. Family history of early CAD  - Lipoprotein A (LPA)  3. Prostate cancer screening  - PSA Total (Reflex To Free) ([facility] only)  4. Need for immunization against influenza  - Flu Vaccine QUAD 672moM (Fluarix, Fluzone & Alfiuria Quad PF)  5. ED (erectile dysfunction) of organic origin Only tolerates '25mg'$  sildenafil which is not effective.   Also has BPH which is not adequately controlled with tamsulosin, which is also likely contributing to near syncope. Change sildenafil to tadalafil (CIALIS) 5 MG tablet; Take 1 tablet (5 mg total) by mouth daily. For BPH (50.0)  Dispense: 30 tablet; Refill: 5  6. Benign prostatic hyperplasia, unspecified whether lower urinary tract symptoms present Having near syncope likely exacerbated by tamsulosin. Change to tadalafil as above.   7. Postural dizziness with near syncope Change tamsulosin to tadalafil as above.  - CBC - EKG 12-Lead  8. Hypogonadism in male  - Testosterone,Free and Total    The entirety of the information documented in the History of Present Illness, Review of Systems and Physical Exam were personally obtained by me. Portions of this information were initially documented by the CMA and reviewed by me for thoroughness and accuracy.     DoLelon HuhMD  [phone]) [phone])  [place]

## 2022-08-23 ENCOUNTER — Ambulatory Visit (INDEPENDENT_AMBULATORY_CARE_PROVIDER_SITE_OTHER): Payer: BC Managed Care – PPO | Admitting: Family Medicine

## 2022-08-23 ENCOUNTER — Encounter: Payer: Self-pay | Admitting: Family Medicine

## 2022-08-23 VITALS — BP 116/62 | HR 63 | Resp 16 | Ht 76.0 in | Wt 179.0 lb

## 2022-08-23 DIAGNOSIS — Z23 Encounter for immunization: Secondary | ICD-10-CM

## 2022-08-23 DIAGNOSIS — N529 Male erectile dysfunction, unspecified: Secondary | ICD-10-CM

## 2022-08-23 DIAGNOSIS — Z8249 Family history of ischemic heart disease and other diseases of the circulatory system: Secondary | ICD-10-CM | POA: Diagnosis not present

## 2022-08-23 DIAGNOSIS — Z125 Encounter for screening for malignant neoplasm of prostate: Secondary | ICD-10-CM | POA: Diagnosis not present

## 2022-08-23 DIAGNOSIS — Z0000 Encounter for general adult medical examination without abnormal findings: Secondary | ICD-10-CM

## 2022-08-23 DIAGNOSIS — R42 Dizziness and giddiness: Secondary | ICD-10-CM | POA: Diagnosis not present

## 2022-08-23 DIAGNOSIS — N400 Enlarged prostate without lower urinary tract symptoms: Secondary | ICD-10-CM | POA: Diagnosis not present

## 2022-08-23 DIAGNOSIS — E291 Testicular hypofunction: Secondary | ICD-10-CM

## 2022-08-23 DIAGNOSIS — R55 Syncope and collapse: Secondary | ICD-10-CM

## 2022-08-23 MED ORDER — TADALAFIL 5 MG PO TABS: 5.0000 mg | ORAL_TABLET | Freq: Every day | ORAL | 5 refills | Status: DC

## 2022-08-23 NOTE — Patient Instructions (Signed)
.   Please review the attached list of medications and notify my office if there are any errors.   . Please bring all of your medications to every appointment so we can make sure that our medication list is the same as yours.   

## 2022-08-24 LAB — COMPREHENSIVE METABOLIC PANEL: Calcium: 9.1 mg/dL (ref 8.6–10.2)

## 2022-08-25 ENCOUNTER — Other Ambulatory Visit: Payer: Self-pay | Admitting: Family Medicine

## 2022-08-25 DIAGNOSIS — R972 Elevated prostate specific antigen [PSA]: Secondary | ICD-10-CM

## 2022-08-25 LAB — COMPREHENSIVE METABOLIC PANEL
AST: 22 IU/L (ref 0–40)
Albumin: 4.6 g/dL (ref 3.9–4.9)
CO2: 25 mmol/L (ref 20–29)
Creatinine, Ser: 0.99 mg/dL (ref 0.76–1.27)

## 2022-08-25 LAB — TESTOSTERONE,FREE AND TOTAL

## 2022-08-26 LAB — LIPOPROTEIN A (LPA): Lipoprotein (a): 20.3 nmol/L (ref <75.0)

## 2022-08-26 LAB — LIPID PANEL
Chol/HDL Ratio: 2.6 ratio (ref 0.0–5.0)
Cholesterol, Total: 169 mg/dL (ref 100–199)
HDL: 66 mg/dL (ref >39)
LDL Chol Calc (NIH): 88 mg/dL (ref 0–99)
Triglycerides: 80 mg/dL (ref 0–149)
VLDL Cholesterol Cal: 15 mg/dL (ref 5–40)

## 2022-08-26 LAB — COMPREHENSIVE METABOLIC PANEL
ALT: 21 IU/L (ref 0–44)
Albumin/Globulin Ratio: 2.2 (ref 1.2–2.2)
Alkaline Phosphatase: 51 IU/L (ref 44–121)
BUN/Creatinine Ratio: 9 — ABNORMAL LOW (ref 10–24)
BUN: 9 mg/dL (ref 8–27)
Bilirubin Total: 0.6 mg/dL (ref 0.0–1.2)
Chloride: 106 mmol/L (ref 96–106)
Globulin, Total: 2.1 g/dL (ref 1.5–4.5)
Glucose: 86 mg/dL (ref 70–99)
Potassium: 4 mmol/L (ref 3.5–5.2)
Sodium: 144 mmol/L (ref 134–144)
Total Protein: 6.7 g/dL (ref 6.0–8.5)
eGFR: 86 mL/min/{1.73_m2} (ref >59)

## 2022-08-26 LAB — PSA TOTAL (REFLEX TO FREE): Prostate Specific Ag, Serum: 11.8 ng/mL — ABNORMAL HIGH (ref 0.0–4.0)

## 2022-08-26 LAB — TESTOSTERONE,FREE AND TOTAL: Testosterone: 960 ng/dL — ABNORMAL HIGH (ref 264–916)

## 2022-08-26 LAB — CBC
Hematocrit: 41.8 % (ref 37.5–51.0)
Hemoglobin: 13.5 g/dL (ref 13.0–17.7)
MCH: 31.3 pg (ref 26.6–33.0)
MCHC: 32.3 g/dL (ref 31.5–35.7)
MCV: 97 fL (ref 79–97)
Platelets: 208 10*3/uL (ref 150–450)
RBC: 4.31 x10E6/uL (ref 4.14–5.80)
RDW: 12.3 % (ref 11.6–15.4)
WBC: 7.6 10*3/uL (ref 3.4–10.8)

## 2022-08-31 ENCOUNTER — Other Ambulatory Visit: Payer: Self-pay | Admitting: Family Medicine

## 2022-08-31 DIAGNOSIS — N419 Inflammatory disease of prostate, unspecified: Secondary | ICD-10-CM

## 2022-08-31 LAB — FPSA% REFLEX
% FREE PSA: 13.2 %
PSA, FREE: 0.75 ng/mL

## 2022-08-31 LAB — PSA TOTAL (REFLEX TO FREE): Prostate Specific Ag, Serum: 5.7 ng/mL — ABNORMAL HIGH (ref 0.0–4.0)

## 2022-08-31 MED ORDER — DOXYCYCLINE HYCLATE 100 MG PO TABS: 100.0000 mg | ORAL_TABLET | Freq: Two times a day (BID) | ORAL | 0 refills | Status: AC

## 2022-10-05 ENCOUNTER — Encounter: Payer: Self-pay | Admitting: Family Medicine

## 2022-10-06 ENCOUNTER — Other Ambulatory Visit: Payer: Self-pay

## 2022-10-06 DIAGNOSIS — N419 Inflammatory disease of prostate, unspecified: Secondary | ICD-10-CM

## 2022-10-06 DIAGNOSIS — R972 Elevated prostate specific antigen [PSA]: Secondary | ICD-10-CM

## 2022-10-09 LAB — PSA TOTAL (REFLEX TO FREE): Prostate Specific Ag, Serum: 3.5 ng/mL (ref 0.0–4.0)

## 2023-02-07 ENCOUNTER — Other Ambulatory Visit: Payer: Self-pay | Admitting: Family Medicine

## 2023-02-07 DIAGNOSIS — N529 Male erectile dysfunction, unspecified: Secondary | ICD-10-CM

## 2023-08-11 ENCOUNTER — Other Ambulatory Visit: Payer: Self-pay | Admitting: Family Medicine

## 2023-08-11 DIAGNOSIS — N529 Male erectile dysfunction, unspecified: Secondary | ICD-10-CM

## 2023-08-15 NOTE — Telephone Encounter (Signed)
Requested by interface surescripts. Future visit in 1 week.  Requested Prescriptions  Pending Prescriptions Disp Refills   tadalafil (CIALIS) 5 MG tablet [Pharmacy Med Name: tadalafil 5 mg tablet] 30 tablet 5    Sig: TAKE ONE TABLET BY MOUTH EVERY DAY BPH     Urology: Erectile Dysfunction Agents Passed - 08/11/2023  8:04 AM      Passed - AST in normal range and within 360 days    AST  Date Value Ref Range Status  08/23/2022 22 0 - 40 IU/L Final         Passed - ALT in normal range and within 360 days    ALT  Date Value Ref Range Status  08/23/2022 21 0 - 44 IU/L Final         Passed - Last BP in normal range    BP Readings from Last 1 Encounters:  08/23/22 116/62         Passed - Valid encounter within last 12 months    Recent Outpatient Visits           11 months ago Annual physical exam   [facility] Malva Limes, MD   1 year ago Annual physical exam   [facility] Malva Limes, MD   3 years ago Annual physical exam   [facility] Malva Limes, MD   3 years ago Strain of right deltoid muscle, initial encounter   [facility] Malva Limes, MD   4 years ago Annual physical exam   [facility] Malva Limes, MD       Future Appointments             In 1 week Fisher, Demetrios Isaacs, MD [facility]

## 2023-08-24 ENCOUNTER — Ambulatory Visit (INDEPENDENT_AMBULATORY_CARE_PROVIDER_SITE_OTHER): Payer: Medicare PPO | Admitting: Family Medicine

## 2023-08-24 ENCOUNTER — Encounter: Payer: Self-pay | Admitting: Family Medicine

## 2023-08-24 VITALS — BP 122/75 | HR 69 | Resp 16 | Ht 76.0 in | Wt 173.2 lb

## 2023-08-24 DIAGNOSIS — Z0000 Encounter for general adult medical examination without abnormal findings: Secondary | ICD-10-CM | POA: Diagnosis not present

## 2023-08-24 DIAGNOSIS — Z136 Encounter for screening for cardiovascular disorders: Secondary | ICD-10-CM | POA: Diagnosis not present

## 2023-08-24 DIAGNOSIS — Z125 Encounter for screening for malignant neoplasm of prostate: Secondary | ICD-10-CM

## 2023-08-24 DIAGNOSIS — Z23 Encounter for immunization: Secondary | ICD-10-CM | POA: Diagnosis not present

## 2023-08-24 DIAGNOSIS — N529 Male erectile dysfunction, unspecified: Secondary | ICD-10-CM | POA: Diagnosis not present

## 2023-08-24 MED ORDER — SILDENAFIL CITRATE 50 MG PO TABS: 25.0000 mg | ORAL_TABLET | Freq: Every day | ORAL | 5 refills | Status: AC | PRN

## 2023-08-24 NOTE — Patient Instructions (Signed)
.   Please review the attached list of medications and notify my office if there are any errors.   . Please bring all of your medications to every appointment so we can make sure that our medication list is the same as yours.   

## 2023-08-24 NOTE — Progress Notes (Signed)
Subjective:    Brian Duncan is a 67 y.o. male who presents for a Welcome to Medicare exam.         Objective:    Today's Vitals   08/24/23 1028  BP: 122/75  Pulse: 69  Resp: 16  Weight: 173 lb 3.2 oz (78.6 kg)  Height: 6\' 4"  (1.93 m)   Body mass index is 21.08 kg/m.  Medications Outpatient Encounter Medications as of 08/24/2023  Medication Sig   aspirin 81 MG tablet Take 81 mg by mouth daily.   CALCIUM PO Take by mouth.   Cholecalciferol (D3 PO) Take by mouth.   Cyanocobalamin (B-12 PO) Take by mouth.   Multiple Vitamins-Minerals (MULTIVITAMIN PO) Take 1 capsule by mouth daily.   Omega-3 Fatty Acids (OMEGA-3 PO) Take by mouth.   tadalafil (CIALIS) 5 MG tablet TAKE ONE TABLET BY MOUTH EVERY DAY BPH   vitamin E 400 UNIT capsule Take 400 Units by mouth daily.   No facility-administered encounter medications on file as of 08/24/2023.     History: Past Medical History:  Diagnosis Date   History of chicken pox    History of kidney stones    Hx of measles    Kidney stone    Status post Mohs surgery for basal cell carcinoma 08/21/2013   [facility]   Past Surgical History:  Procedure Laterality Date   COLONOSCOPY  2009   per pt report was advised to repeat in 10 years   COLONOSCOPY WITH PROPOFOL N/A 11/14/2018   Procedure: COLONOSCOPY WITH PROPOFOL;  Surgeon: Midge Minium, MD;  Location: [facility] END[facility]COPY;  Service: Endoscopy;  Laterality: N/A;   EXTRACORPOREAL SHOCK WAVE LITHOTRIPSY Left 04/20/2018   Procedure: LEFT EXTRACORPOREAL SHOCK WAVE LITHOTRIPSY (ESWL);  Surgeon: Bjorn Pippin, MD;  Location: [facility];  Service: Urology;  Laterality: Left;   MOHS SURGERY  2014   Right Ear   TONSILLECTOMY      Family History  Problem Relation Age of Onset   Diabetes Mother    Heart disease Mother    Heart disease Father    Prostate cancer Father    Uterine cancer Sister    Heart disease Brother    Social History   Occupational History   Occupation: Retired    Comment: Former  Art gallery manager for [employer]  Tobacco Use   Smoking status: Never   Smokeless tobacco: Never  Vaping Use   Vaping status: Never Used  Substance and Sexual Activity   Alcohol use: No    Alcohol/week: 0.0 standard drinks of alcohol   Drug use: No   Sexual activity: Not on file    Tobacco Counseling Counseling given: Not Answered   Immunizations and Health Maintenance Immunization History  Administered Date(s) Administered   Influenza,inj,Quad PF,6+ Mos 05/12/2015, 05/13/2016, 05/19/2017, 05/29/2018, 07/10/2019, 08/21/2021, 08/23/2022   Influenza-Unspecified 07/22/2020   Moderna Sars-Covid-2 Vaccination 12/06/2019, 01/03/2020, 07/22/2020   Td 08/15/2020   Tdap 03/12/2010   Zoster Recombinant(Shingrix) 05/19/2017, 07/25/2017   Health Maintenance Due  Topic Date Due   Medicare Annual Wellness (AWV)  Never done   HIV Screening  Never done   INFLUENZA VACCINE  04/14/2023   COVID-19 Vaccine (4 - 2023-24 season) 05/15/2023    Activities of Daily Living    08/24/2023   10:31 AM  In your present state of health, do you have any difficulty performing the following activities:  Hearing? 0  Vision? 0  Difficulty concentrating or making decisions? 0  Walking or climbing stairs? 0  Dressing or  bathing? 0  Doing errands, shopping? 0    Physical Exam   Physical Exam (optional), or other factors deemed appropriate based on the beneficiary's medical and social history and current clinical standards.   Advanced Directives:     EKG:  normal EKG, normal sinus rhythm     Assessment:    This is a routine IPPE  Vision/Hearing screen Vision Screening   Right eye Left eye Both eyes  Without correction 20/70 20/70 20/100  With correction     Comments: Wears to drive    Goals   None      Depression Screen    08/24/2023   10:29 AM 08/23/2022   10:54 AM 08/21/2021   10:16 AM 08/15/2020   10:21 AM  PHQ 2/9 Scores  PHQ - 2 Score 0 0 0 0  PHQ- 9 Score  0 0 0     Fall Risk     08/24/2023   10:28 AM  Fall Risk   Falls in the past year? 0  Number falls in past yr: 0  Injury with Fall? 0  Risk for fall due to : No Fall Risks    Cognitive Function        08/24/2023   10:32 AM  6CIT Screen  What Year? 0 points  What month? 0 points  What time? 0 points  Count back from 20 0 points  Months in reverse 0 points  Repeat phrase 0 points  Total Score 0 points    Patient Care Team: Malva Limes, MD as PCP - General (Family Medicine) Dasher, Cliffton Asters, MD (Dermatology) Bjorn Pippin, MD as Attending Physician (Urology) Alfredo Martinez, MD as Consulting Physician (Urology) Isla Pence, OD (Optometry) Dasher, Cliffton Asters, MD (Dermatology)     Plan:     I have personally reviewed and noted the following in the patient's chart:   Medical and social history Use of alcohol, tobacco or illicit drugs  Current medications and supplements Functional ability and status Nutritional status Physical activity Advanced directives List of other physicians Hospitalizations, surgeries, and ER visits in previous 12 months Vitals Screenings to include cognitive, depression, and falls Referrals and appointments  In addition, I have reviewed and discussed with patient certain preventive protocols, quality metrics, and best practice recommendations. A written personalized care plan for preventive services as well as general preventive health recommendations were provided to patient.     Mila Merry, MD 08/24/2023

## 2023-08-25 LAB — LIPID PANEL
Chol/HDL Ratio: 2.9 {ratio} (ref 0.0–5.0)
Cholesterol, Total: 192 mg/dL (ref 100–199)
HDL: 66 mg/dL (ref >39)
LDL Chol Calc (NIH): 110 mg/dL — ABNORMAL HIGH (ref 0–99)
Triglycerides: 89 mg/dL (ref 0–149)
VLDL Cholesterol Cal: 16 mg/dL (ref 5–40)

## 2023-08-25 LAB — COMPREHENSIVE METABOLIC PANEL
ALT: 16 [IU]/L (ref 0–44)
AST: 27 [IU]/L (ref 0–40)
Albumin: 4.4 g/dL (ref 3.9–4.9)
Alkaline Phosphatase: 51 [IU]/L (ref 44–121)
BUN/Creatinine Ratio: 7 — ABNORMAL LOW (ref 10–24)
BUN: 8 mg/dL (ref 8–27)
Bilirubin Total: 0.5 mg/dL (ref 0.0–1.2)
CO2: 25 mmol/L (ref 20–29)
Calcium: 9.6 mg/dL (ref 8.6–10.2)
Chloride: 104 mmol/L (ref 96–106)
Creatinine, Ser: 1.07 mg/dL (ref 0.76–1.27)
Globulin, Total: 2.2 g/dL (ref 1.5–4.5)
Glucose: 93 mg/dL (ref 70–99)
Potassium: 4.6 mmol/L (ref 3.5–5.2)
Sodium: 142 mmol/L (ref 134–144)
Total Protein: 6.6 g/dL (ref 6.0–8.5)
eGFR: 77 mL/min/{1.73_m2} (ref >59)

## 2023-08-25 LAB — PSA: Prostate Specific Ag, Serum: 2.4 ng/mL (ref 0.0–4.0)

## 2023-08-26 ENCOUNTER — Encounter: Payer: BC Managed Care – PPO | Admitting: Family Medicine

## 2024-01-23 ENCOUNTER — Other Ambulatory Visit: Payer: Self-pay | Admitting: Family Medicine

## 2024-01-23 DIAGNOSIS — N529 Male erectile dysfunction, unspecified: Secondary | ICD-10-CM

## 2024-07-17 NOTE — Patient Instructions (Signed)
 SABRA  Please review the attached list of medications and notify my office if there are any errors.   . Please bring all of your medications to every appointment so we can make sure that our medication list is the same as yours.

## 2024-07-30 ENCOUNTER — Other Ambulatory Visit: Payer: Self-pay | Admitting: Family Medicine

## 2024-07-30 DIAGNOSIS — N529 Male erectile dysfunction, unspecified: Secondary | ICD-10-CM

## 2024-08-28 ENCOUNTER — Encounter: Payer: Medicare PPO | Admitting: Family Medicine

## 2024-08-29 ENCOUNTER — Encounter: Payer: Self-pay | Admitting: Family Medicine

## 2024-08-29 ENCOUNTER — Ambulatory Visit: Payer: Self-pay | Admitting: Family Medicine

## 2024-08-29 VITALS — BP 121/78 | HR 60 | Ht 76.0 in | Wt 161.2 lb

## 2024-08-29 DIAGNOSIS — Z0000 Encounter for general adult medical examination without abnormal findings: Secondary | ICD-10-CM

## 2024-08-29 DIAGNOSIS — Z23 Encounter for immunization: Secondary | ICD-10-CM

## 2024-08-29 DIAGNOSIS — R1909 Other intra-abdominal and pelvic swelling, mass and lump: Secondary | ICD-10-CM | POA: Diagnosis not present

## 2024-08-29 DIAGNOSIS — Z0001 Encounter for general adult medical examination with abnormal findings: Secondary | ICD-10-CM | POA: Diagnosis not present

## 2024-08-29 DIAGNOSIS — Z125 Encounter for screening for malignant neoplasm of prostate: Secondary | ICD-10-CM | POA: Diagnosis not present

## 2024-08-29 DIAGNOSIS — R634 Abnormal weight loss: Secondary | ICD-10-CM

## 2024-08-29 NOTE — Progress Notes (Signed)
 Complete physical exam   Patient: Brian Duncan   DOB: 1955-12-17   68 y.o. Male  MRN: [id number] Visit Date: 08/29/2024  Today's healthcare provider: Nancyann Perry, MD    Subjective    Discussed the use of AI scribe software for clinical note transcription with the patient, who gave verbal consent to proceed.   History of Present Illness   RONDY KRUPINSKI is a 68 year old male who presents for a yearly physical exam and evaluation of pelvic lumps.  He has noticed lumps in his pelvic area for the past six to eight months, which have become more visible over time. There is no associated pain or soreness, but he is concerned about his nature and mentions he could have been present for over a year.  He experiences ringing in his ears, which does not interfere with daily activities. No chest pain, trouble breathing, shortness of breath, stomach problems, cramping, or changes in bowel habits.  He takes a low dose aspirin daily, along with vitamin D3, B12, omega-3, and a multivitamin. He alternates the intake of D3 and B12 with omega-3 every other day. He recalls the B12 dose as 500 mcg and believes the D3 dose might be 2000 IU, while omega-3 is about 1000 mg.  He maintains an active lifestyle, engaging in yard work, walking outdoors and on a treadmill, and has recently started lifting weights. He has intentionally lost some weight through these activities.  He mentions a family history of heart disease, with an older brother who died suddenly of a heart attack. He expresses concern about his cardiovascular health due to past poor dietary habits, although he has since improved his diet.          Past Medical History:  Diagnosis Date   History of chicken pox    History of kidney stones    Hx of measles    Kidney stone    Status post Mohs surgery for basal cell carcinoma 08/21/2013   [facility]   Past Surgical History:  Procedure Laterality Date   COLONOSCOPY  2009   per pt report  was advised to repeat in 10 years   COLONOSCOPY WITH PROPOFOL  N/A 11/14/2018   Procedure: COLONOSCOPY WITH PROPOFOL ;  Surgeon: Jinny Carmine, MD;  Location: [facility] ENDOSCOPY;  Service: Endoscopy;  Laterality: N/A;   EXTRACORPOREAL SHOCK WAVE LITHOTRIPSY Left 04/20/2018   Procedure: LEFT EXTRACORPOREAL SHOCK WAVE LITHOTRIPSY (ESWL);  Surgeon: Watt Rush, MD;  Location: [facility];  Service: Urology;  Laterality: Left;   MOHS SURGERY  2014   Right Ear   TONSILLECTOMY     Social History   Socioeconomic History   Marital status: Married    Spouse name: Not on file   Number of children: 0   Years of education: Not on file   Highest education level: Bachelor's degree (e.g., BA, AB, BS)  Occupational History   Occupation: Retired    Comment: Former art gallery manager for [employer]  Tobacco Use   Smoking status: Never   Smokeless tobacco: Never  Vaping Use   Vaping status: Never Used  Substance and Sexual Activity   Alcohol use: No    Alcohol/week: 0.0 standard drinks of alcohol   Drug use: No   Sexual activity: Not on file  Other Topics Concern   Not on file  Social History Narrative   Not on file   Social Drivers of Health   Tobacco Use: Low Risk (08/29/2024)   Patient History  Smoking Tobacco Use: Never    Smokeless Tobacco Use: Never    Passive Exposure: Not on file  Financial Resource Strain: Patient Declined (08/29/2024)   Overall Financial Resource Strain (CARDIA)    Difficulty of Paying Living Expenses: Patient declined  Food Insecurity: Patient Declined (08/29/2024)   Epic    Worried About Programme Researcher, Broadcasting/film/video in the Last Year: Patient declined    Barista in the Last Year: Patient declined  Transportation Needs: Unknown (08/29/2024)   Epic    Lack of Transportation (Medical): No    Lack of Transportation (Non-Medical): Patient declined  Physical Activity: Insufficiently Active (08/29/2024)   Exercise Vital Sign    Days of Exercise per Week: 3 days    Minutes of Exercise per  Session: 30 min  Stress: No Stress Concern Present (08/29/2024)   [employer] of Occupational Health - Occupational Stress Questionnaire    Feeling of Stress: Only a little  Social Connections: Unknown (08/29/2024)   Social Connection and Isolation Panel    Frequency of Communication with Friends and Family: Patient declined    Frequency of Social Gatherings with Friends and Family: Patient declined    Attends Religious Services: Patient declined    Active Member of Clubs or Organizations: Patient declined    Attends Engineer, Structural: More than 4 times per year    Marital Status: Patient declined  Intimate Partner Violence: Patient Declined (08/29/2024)   Epic    Fear of Current or Ex-Partner: Patient declined    Emotionally Abused: Patient declined    Physically Abused: Patient declined    Sexually Abused: Patient declined  Depression (PHQ2-9): Low Risk (08/29/2024)   Depression (PHQ2-9)    PHQ-2 Score: 0  Alcohol Screen: Low Risk (08/23/2023)   Alcohol Screen    Last Alcohol Screening Score (AUDIT): 0  Housing: Patient Declined (08/29/2024)   Epic    Unable to Pay for Housing in the Last Year: Patient declined    Number of Times Moved in the Last Year: Not on file    Homeless in the Last Year: Patient declined  Utilities: Patient Declined (08/29/2024)   Epic    Threatened with loss of utilities: Patient declined  Health Literacy: Patient Declined (08/29/2024)   B1300 Health Literacy    Frequency of need for help with medical instructions: Patient declines to respond   Family Status  Relation Name Status   Mother  Deceased at age 15   Father  Alive   Sister  Alive   Brother  Deceased at age 19  No partnership data on file   Family History  Problem Relation Age of Onset   Diabetes Mother    Heart disease Mother    Heart disease Father    Prostate cancer Father    Uterine cancer Sister    Heart disease Brother    Allergies[1]  Patient Care  Team: Gasper Nancyann BRAVO, MD as PCP - General (Family Medicine) Dasher, Alm LABOR, MD (Dermatology) Watt Rush, MD as Attending Physician (Urology) Gaston Hamilton, MD as Consulting Physician (Urology) Mevelyn JONETTA Bathe, OD (Optometry) Dasher, Alm LABOR, MD (Dermatology)   Medications: Show/hide medication list[2]  Review of Systems  Constitutional:  Negative for appetite change, chills and fever.  Respiratory:  Negative for chest tightness, shortness of breath and wheezing.   Cardiovascular:  Negative for chest pain and palpitations.  Gastrointestinal:  Negative for abdominal pain, nausea and vomiting.      Objective  BP 121/78 (BP Location: Right Arm, Patient Position: Sitting, Cuff Size: Normal)   Pulse 60   Ht 6' 4 (1.93 m)   Wt 161 lb 3.2 oz (73.1 kg)   SpO2 100%   BMI 19.62 kg/m    Physical Exam  General Appearance:    Thin male. Alert, cooperative, in no acute distress, appears stated age  Head:    Normocephalic, without obvious abnormality, atraumatic  Eyes:    PERRL, conjunctiva/corneas clear, EOM's intact, fundi    benign, both eyes       Ears:    Normal TM's and external ear canals, both ears  Nose:   Nares normal, septum midline, mucosa normal, no drainage   or sinus tenderness  Throat:   Lips, mucosa, and tongue normal; teeth and gums normal  Neck:   Supple, symmetrical, trachea midline, no adenopathy;       thyroid:  No enlargement/tenderness/nodules; no carotid   bruit or JVD  Back:     Symmetric, no curvature, ROM normal, no CVA tenderness  Lungs:     Clear to auscultation bilaterally, respirations unlabored  Chest wall:    No tenderness or deformity  Heart:    Normal heart rate. Normal rhythm. No murmurs, rubs, or gallops.  S1 and S2 normal  Abdomen:     Soft, non-tender, bowel sounds active all four quadrants,    no masses, no organomegaly  inguinal:    Bilateral reducible masses consistent with bilateral hernia. Non tender, no erythema.   Rectal:     deferred  Extremities:   All extremities are intact. No cyanosis or edema  Pulses:   2+ and symmetric all extremities  Skin:   Skin color, texture, turgor normal, no rashes or lesions  Lymph nodes:   Cervical, supraclavicular, and axillary nodes normal  Neurologic:   CNII-XII intact. Normal strength, sensation and reflexes      throughout       Last depression screening scores    08/29/2024   10:51 AM 08/24/2023   10:29 AM 08/23/2022   10:54 AM  PHQ 2/9 Scores  PHQ - 2 Score 0 0 0  PHQ- 9 Score 0  0      Data saved with a previous flowsheet row definition   Last fall risk screening    08/29/2024   10:56 AM  Fall Risk   Falls in the past year? 0  Number falls in past yr: 0  Injury with Fall? 0  Risk for fall due to : No Fall Risks  Follow up Falls evaluation completed   Last Audit-C alcohol use screening    08/29/2024   10:56 AM  Alcohol Use Disorder Test (AUDIT)  1. How often do you have a drink containing alcohol? 0  2. How many drinks containing alcohol do you have on a typical day when you are drinking? 0   A score of 3 or more in women, and 4 or more in men indicates increased risk for alcohol abuse, EXCEPT if all of the points are from question 1   No results found for any visits on 08/29/24.  Assessment & Plan    Routine Health Maintenance and Physical Exam  Exercise Activities and Dietary recommendations  Goals   None     Immunization History  Administered Date(s) Administered   INFLUENZA, HIGH DOSE SEASONAL PF 08/29/2024   IPV 05/03/2024   Influenza, Seasonal, Injecte, Preservative Fre 08/24/2023   Influenza,inj,Quad PF,6+ Mos 05/12/2015, 05/13/2016, 05/19/2017, 05/29/2018, 07/10/2019, 08/21/2021,  08/23/2022   Influenza-Unspecified 07/22/2020   Moderna Sars-Covid-2 Vaccination 12/06/2019, 01/03/2020, 07/22/2020   PNEUMOCOCCAL CONJUGATE-20 08/29/2024   Td 08/15/2020   Tdap 03/12/2010   Zoster Recombinant(Shingrix ) 05/19/2017, 07/25/2017     Health Maintenance  Topic Date Due   COVID-19 Vaccine (4 - 2025-26 season) 05/14/2024   Medicare Annual Wellness (AWV)  08/29/2025   Colonoscopy  11/14/2028   DTaP/Tdap/Td (3 - Td or Tdap) 08/15/2030   Pneumococcal Vaccine: 50+ Years  Completed   Influenza Vaccine  Completed   Hepatitis C Screening  Completed   Zoster Vaccines- Shingrix   Completed   Meningococcal B Vaccine  Aged Out    Discussed health benefits of physical activity, and encouraged him to engage in regular exercise appropriate for his age and condition.     Bilateral inguinal masses c/with hernia He is concerned about other potential causes of swelling in groin, specifically lyphomas.  - Ordered ultrasound of the inguinal area to confirm hernias and assess for lymph node involvement.  - Encouraged continuation of exercise routine.   Discussed cardiac risk screening via coronary CT. He is looking into [employer] screenings which may perform this test.            Nancyann Perry, MD  [facility] [phone] (phone) [phone] (fax)  [facility]     [1] No Known Allergies [2]  Outpatient Medications Prior to Visit  Medication Sig   Cholecalciferol (D3 PO) Take by mouth. (Patient taking differently: Take 1,000 Units by mouth every other day.)   aspirin 81 MG tablet Take 81 mg by mouth daily.   CALCIUM PO Take by mouth.   Cyanocobalamin (B-12 PO) Take by mouth.   Multiple Vitamins-Minerals (MULTIVITAMIN PO) Take 1 capsule by mouth daily.   Omega-3 Fatty Acids (OMEGA-3 PO) Take by mouth.   sildenafil  (VIAGRA ) 50 MG tablet Take 0.5-1 tablets (25-50 mg total) by mouth daily as needed for erectile dysfunction.   tadalafil  (CIALIS ) 5 MG tablet TAKE ONE TABLET BY MOUTH ONCE DAILY FOR BPH.   vitamin E 400 UNIT capsule Take 400 Units by mouth daily.   No facility-administered medications prior to visit.

## 2024-08-29 NOTE — Progress Notes (Signed)
 Chief Complaint  Patient presents with   Medicare Wellness   Annual Exam     Subjective:   Brian Duncan is a 68 y.o. male who presents for a Medicare Annual Wellness Visit.  Visit info / Clinical Intake: Medicare Wellness Visit Type:: Subsequent Annual Wellness Visit Persons participating in visit and providing information:: patient Medicare Wellness Visit Mode:: In-person (required for WTM) Interpreter Needed?: No Pre-visit prep was completed: no AWV questionnaire completed by patient prior to visit?: no Living arrangements:: lives with spouse/significant other Patient's Overall Health Status Rating: very good Typical amount of pain: none Does pain affect daily life?: no Are you currently prescribed opioids?: no  Dietary Habits and Nutritional Risks How many meals a day?: 2 Eats fruit and vegetables daily?: yes Most meals are obtained by: preparing own meals; eating out In the last 2 weeks, have you had any of the following?: none Diabetic:: no  Functional Status Activities of Daily Living (to include ambulation/medication): Independent Ambulation: Independent Medication Administration: Independent Home Management (perform basic housework or laundry): Independent Manage your own finances?: yes Primary transportation is: driving Concerns about vision?: no *vision screening is required for WTM* Concerns about hearing?: no  Fall Screening Falls in the past year?: 0 Number of falls in past year: 0 Was there an injury with Fall?: 0 Fall Risk Category Calculator: 0 Patient Fall Risk Level: Low Fall Risk  Fall Risk Patient at Risk for Falls Due to: No Fall Risks Fall risk Follow up: Falls evaluation completed  Home and Transportation Safety: All rugs have non-skid backing?: yes All stairs or steps have railings?: yes Grab bars in the bathtub or shower?: yes Have non-skid surface in bathtub or shower?: (!) no Good home lighting?: yes Regular seat belt use?:  yes Hospital stays in the last year:: no  Cognitive Assessment Difficulty concentrating, remembering, or making decisions? : no Will 6CIT or Mini Cog be Completed: no 6CIT or Mini Cog Declined: patient alert, oriented, able to answer questions appropriately and recall recent events  Advance Directives (For Healthcare) Does Patient Have a Medical Advance Directive?: Yes Type of Advance Directive: Healthcare Power of Attorney  Reviewed/Updated  Reviewed/Updated: Reviewed All (Medical, Surgical, Family, Medications, Allergies, Care Teams, Patient Goals)    Allergies (verified) Patient has no known allergies.   Current Medications (verified) Outpatient Encounter Medications as of 08/29/2024  Medication Sig   aspirin 81 MG tablet Take 81 mg by mouth daily.   CALCIUM PO Take by mouth.   Cholecalciferol (D3 PO) Take by mouth.   Cyanocobalamin (B-12 PO) Take by mouth.   Multiple Vitamins-Minerals (MULTIVITAMIN PO) Take 1 capsule by mouth daily.   Omega-3 Fatty Acids (OMEGA-3 PO) Take by mouth.   sildenafil  (VIAGRA ) 50 MG tablet Take 0.5-1 tablets (25-50 mg total) by mouth daily as needed for erectile dysfunction.   tadalafil  (CIALIS ) 5 MG tablet TAKE ONE TABLET BY MOUTH ONCE DAILY FOR BPH.   vitamin E 400 UNIT capsule Take 400 Units by mouth daily.   No facility-administered encounter medications on file as of 08/29/2024.    History: Past Medical History:  Diagnosis Date   History of chicken pox    History of kidney stones    Hx of measles    Kidney stone    Status post Mohs surgery for basal cell carcinoma 08/21/2013   [facility]   Past Surgical History:  Procedure Laterality Date   COLONOSCOPY  2009   per pt report was advised to repeat in 10  years   COLONOSCOPY WITH PROPOFOL  N/A 11/14/2018   Procedure: COLONOSCOPY WITH PROPOFOL ;  Surgeon: Jinny Carmine, MD;  Location: [facility] ENDOSCOPY;  Service: Endoscopy;  Laterality: N/A;   EXTRACORPOREAL SHOCK WAVE LITHOTRIPSY Left 04/20/2018    Procedure: LEFT EXTRACORPOREAL SHOCK WAVE LITHOTRIPSY (ESWL);  Surgeon: Watt Rush, MD;  Location: [facility] ORS;  Service: Urology;  Laterality: Left;   MOHS SURGERY  2014   Right Ear   TONSILLECTOMY     Family History  Problem Relation Age of Onset   Diabetes Mother    Heart disease Mother    Heart disease Father    Prostate cancer Father    Uterine cancer Sister    Heart disease Brother    Social History   Occupational History   Occupation: Retired    Comment: Former art gallery manager for [employer]  Tobacco Use   Smoking status: Never   Smokeless tobacco: Never  Vaping Use   Vaping status: Never Used  Substance and Sexual Activity   Alcohol use: No    Alcohol/week: 0.0 standard drinks of alcohol   Drug use: No   Sexual activity: Not on file   Tobacco Counseling Counseling given: Not Answered  SDOH Screenings   Food Insecurity: Patient Declined (08/29/2024)  Housing: Patient Declined (08/29/2024)  Transportation Needs: Unknown (08/29/2024)  Utilities: Patient Declined (08/29/2024)  Alcohol Screen: Low Risk (08/23/2023)  Depression (PHQ2-9): Low Risk (08/29/2024)  Financial Resource Strain: Patient Declined (08/29/2024)  Physical Activity: Insufficiently Active (08/29/2024)  Social Connections: Unknown (08/29/2024)  Stress: No Stress Concern Present (08/29/2024)  Tobacco Use: Low Risk (08/29/2024)  Health Literacy: Patient Declined (08/29/2024)   See flowsheets for full screening details  Depression Screen PHQ 2 & 9 Depression Scale- Over the past 2 weeks, how often have you been bothered by any of the following problems? Little interest or pleasure in doing things: 0 Feeling down, depressed, or hopeless (PHQ Adolescent also includes...irritable): 0 PHQ-2 Total Score: 0 Trouble falling or staying asleep, or sleeping too much: 0 Feeling tired or having little energy: 0 Poor appetite or overeating (PHQ Adolescent also includes...weight loss): 0 Feeling bad about yourself - or  that you are a failure or have let yourself or your family down: 0 Trouble concentrating on things, such as reading the newspaper or watching television (PHQ Adolescent also includes...like school work): 0 Moving or speaking so slowly that other people could have noticed. Or the opposite - being so fidgety or restless that you have been moving around a lot more than usual: 0 Thoughts that you would be better off dead, or of hurting yourself in some way: 0 PHQ-9 Total Score: 0 If you checked off any problems, how difficult have these problems made it for you to do your work, take care of things at home, or get along with other people?: Not difficult at all     Goals Addressed   None          Objective:    Today's Vitals   08/29/24 1050  BP: 121/78  Pulse: 60  SpO2: 100%  Weight: 161 lb 3.2 oz (73.1 kg)  Height: 6' 4 (1.93 m)   Body mass index is 19.62 kg/m.  Hearing/Vision screen Vision Screening - Comments:: Pt had an eye exam in spring . Goes to [facility]. Immunizations and Health Maintenance Health Maintenance  Topic Date Due   Pneumococcal Vaccine: 50+ Years (1 of 1 - PCV) Never done   Influenza Vaccine  04/13/2024   COVID-19 Vaccine (4 -  2025-26 season) 05/14/2024   Medicare Annual Wellness (AWV)  08/29/2025   Colonoscopy  11/14/2028   DTaP/Tdap/Td (3 - Td or Tdap) 08/15/2030   Hepatitis C Screening  Completed   Zoster Vaccines- Shingrix   Completed   Meningococcal B Vaccine  Aged Out        Assessment/Plan:  This is a routine wellness examination for Brian Duncan.  Patient Care Team: Gasper Nancyann BRAVO, MD as PCP - General (Family Medicine) Dasher, Alm LABOR, MD (Dermatology) Watt Rush, MD as Attending Physician (Urology) Gaston Hamilton, MD as Consulting Physician (Urology) Mevelyn JONETTA Bathe, OD (Optometry) Dasher, Alm LABOR, MD (Dermatology)  I have personally reviewed and noted the following in the patients chart:   Medical and social history Use of alcohol,  tobacco or illicit drugs  Current medications and supplements including opioid prescriptions. Functional ability and status Nutritional status Physical activity Advanced directives List of other physicians Hospitalizations, surgeries, and ER visits in previous 12 months Vitals Screenings to include cognitive, depression, and falls Referrals and appointments  Orders Placed This Encounter  Procedures   Flu vaccine HIGH DOSE PF(Fluzone Trivalent)   Pneumococcal conjugate vaccine 20-valent (Prevnar 20)   In addition, I have reviewed and discussed with patient certain preventive protocols, quality metrics, and best practice recommendations. A written personalized care plan for preventive services as well as general preventive health recommendations were provided to patient.   Nancyann Gasper, MD   08/29/2024    After Visit Summary: (In Person-Declined) Patient declined AVS at this time.

## 2024-08-30 ENCOUNTER — Ambulatory Visit: Payer: Self-pay | Admitting: Family Medicine

## 2024-08-30 LAB — CBC WITH DIFFERENTIAL/PLATELET
Basophils Absolute: 0 x10E3/uL (ref 0.0–0.2)
Basos: 1 %
EOS (ABSOLUTE): 0 x10E3/uL (ref 0.0–0.4)
Eos: 0 %
Hematocrit: 44.9 % (ref 37.5–51.0)
Hemoglobin: 14.5 g/dL (ref 13.0–17.7)
Immature Grans (Abs): 0 x10E3/uL (ref 0.0–0.1)
Immature Granulocytes: 0 %
Lymphocytes Absolute: 1.4 x10E3/uL (ref 0.7–3.1)
Lymphs: 28 %
MCH: 32.4 pg (ref 26.6–33.0)
MCHC: 32.3 g/dL (ref 31.5–35.7)
MCV: 100 fL — ABNORMAL HIGH (ref 79–97)
Monocytes Absolute: 0.4 x10E3/uL (ref 0.1–0.9)
Monocytes: 9 %
Neutrophils Absolute: 3 x10E3/uL (ref 1.4–7.0)
Neutrophils: 62 %
Platelets: 236 x10E3/uL (ref 150–450)
RBC: 4.48 x10E6/uL (ref 4.14–5.80)
RDW: 12.4 % (ref 11.6–15.4)
WBC: 4.9 x10E3/uL (ref 3.4–10.8)

## 2024-08-30 LAB — COMPREHENSIVE METABOLIC PANEL WITH GFR
ALT: 21 IU/L (ref 0–44)
AST: 27 IU/L (ref 0–40)
Albumin: 4.6 g/dL (ref 3.9–4.9)
Alkaline Phosphatase: 48 IU/L (ref 47–123)
BUN/Creatinine Ratio: 7 — ABNORMAL LOW (ref 10–24)
BUN: 7 mg/dL — ABNORMAL LOW (ref 8–27)
Bilirubin Total: 0.7 mg/dL (ref 0.0–1.2)
CO2: 28 mmol/L (ref 20–29)
Calcium: 9.8 mg/dL (ref 8.6–10.2)
Chloride: 102 mmol/L (ref 96–106)
Creatinine, Ser: 1.04 mg/dL (ref 0.76–1.27)
Globulin, Total: 2.4 g/dL (ref 1.5–4.5)
Glucose: 102 mg/dL — ABNORMAL HIGH (ref 70–99)
Potassium: 4.3 mmol/L (ref 3.5–5.2)
Sodium: 141 mmol/L (ref 134–144)
Total Protein: 7 g/dL (ref 6.0–8.5)
eGFR: 79 mL/min/1.73 (ref >59)

## 2024-08-30 LAB — PSA TOTAL (REFLEX TO FREE): Prostate Specific Ag, Serum: 3 ng/mL (ref 0.0–4.0)

## 2024-08-30 LAB — LIPID PANEL
Chol/HDL Ratio: 2.8 ratio (ref 0.0–5.0)
Cholesterol, Total: 207 mg/dL — ABNORMAL HIGH (ref 100–199)
HDL: 75 mg/dL (ref >39)
LDL Chol Calc (NIH): 115 mg/dL — ABNORMAL HIGH (ref 0–99)
Triglycerides: 94 mg/dL (ref 0–149)
VLDL Cholesterol Cal: 17 mg/dL (ref 5–40)

## 2024-08-30 LAB — TSH: TSH: 2.47 u[IU]/mL (ref 0.450–4.500)

## 2024-09-03 LAB — LIPOPROTEIN A (LPA): Lipoprotein (a): 17.8 nmol/L

## 2024-09-03 LAB — APOLIPOPROTEIN B: Apolipoprotein B: 78 mg/dL

## 2024-09-03 LAB — SPECIMEN STATUS REPORT

## 2024-09-17 DIAGNOSIS — R1909 Other intra-abdominal and pelvic swelling, mass and lump: Secondary | ICD-10-CM

## 2024-09-20 ENCOUNTER — Other Ambulatory Visit: Payer: Self-pay | Admitting: Family Medicine

## 2024-09-20 DIAGNOSIS — R1909 Other intra-abdominal and pelvic swelling, mass and lump: Secondary | ICD-10-CM

## 2024-09-21 ENCOUNTER — Ambulatory Visit
Admission: RE | Admit: 2024-09-21 | Discharge: 2024-09-21 | Disposition: A | Source: Ambulatory Visit | Attending: Family Medicine | Admitting: Family Medicine

## 2024-09-21 DIAGNOSIS — R1909 Other intra-abdominal and pelvic swelling, mass and lump: Secondary | ICD-10-CM | POA: Insufficient documentation

## 2024-09-28 ENCOUNTER — Ambulatory Visit: Payer: Self-pay | Admitting: Family Medicine

## 2024-09-28 DIAGNOSIS — K4020 Bilateral inguinal hernia, without obstruction or gangrene, not specified as recurrent: Secondary | ICD-10-CM

## 2024-10-11 ENCOUNTER — Encounter: Payer: Self-pay | Admitting: General Surgery

## 2024-10-11 ENCOUNTER — Ambulatory Visit: Admitting: General Surgery

## 2024-10-11 NOTE — Patient Instructions (Signed)
 You have requested to have your Inguinal Hernia Repaired. This will be done at [facility] with Dr. Marinda .   If you are on any injectable weight loss medication, you will need to stop taking your GLP-1 injectable (weight loss) medications 8 days before your surgery to avoid any complications with anesthesia.    You will need to arrange to be out of work for approximately 1-2 weeks and then you may return with a lifting restriction for 4 more weeks. If you have FMLA or Disability paperwork that needs to be filled out, please have your company fax your paperwork to [phone] or you may drop this by either office.    Please see the (blue)pre-care form that you have been given today. You will receive several phone calls from [facility] prior to your surgery date.   If you have any questions, please call our office.  You may have a bruise in your groin and also swelling and brusing in your testicle area. You may use ice 4-5 times daily for 15-20 minutes each time. Make sure that you place a barrier between you and the ice pack. To decrease the swelling, you may roll up a bath towel and place it vertically in between your thighs with your testicles resting on the towel. You will want to keep this area elevated as much as possible for several days following surgery.   Laparoscopic Surgery for Groin Hernia in Adults: What to Expect  Laparoscopic surgery for groin hernia is a surgery to treat a weak spot in the groin muscles where tissue from inside your belly pushes out. Groin hernia is also called inguinal hernia.  This surgery may be planned or it may be done as an emergency surgery. During the surgery, tissue that has pushed out of the belly is moved back into place. The opening in the groin muscles is closed.  Laparoscopic surgery is done through small cuts in the belly. A scope with a light and camera (laparoscope) is used. Tell a health care provider about: Any allergies you have. All  medicines you're taking. These include vitamins, herbs, eye drops, and creams. Any problems you or family members have had with anesthesia. Any bleeding problems you have. Any surgeries you've had. Any medical problems you have. Whether you're pregnant or may be pregnant. What are the risks? Your health care provider will talk with you about risks. These may include: Infection. Bleeding, blood clots, or fluid build up in the area of the hernia. Allergy to medicines or the mesh, if a mesh was used. Damage to nearby structures in the belly. Long-term pain and swelling of the scrotum. Trouble peeing or pooping. The hernia coming back after the surgery. In some cases, your provider may need to switch from a laparoscopic surgery to one large cut in the groin (open surgery). You may need an open surgery if: You have a hernia that's hard to repair. You bleed a lot during the laparoscopic surgery. You have problems when gas is put into your belly. What happens before? Medicines Ask about changing or stopping: Any medicines you take. Any vitamins, herbs, or supplements you take. Do not take aspirin or ibuprofen  unless you're told to. Surgery safety For your safety, you may: Need to wash your skin with a soap that kills germs. Get antibiotics. Have your surgery site marked. Have hair removed at the surgery site. General instructions Do not smoke, vape, or use nicotine or tobacco for at least 4 weeks before your  surgery. Ask if you'll be staying overnight in the hospital. If you'll be going home right after the surgery, plan to have a responsible adult: Drive you home from the hospital or clinic. You won't be allowed to drive. Stay with you for the time you're told. What happens during laparoscopic surgery for groin hernia? An IV will be put into a vein in your hand or arm. You may be given: A sedative to help you relax. Anesthesia to keep you from feeling pain. Three small cuts will  be made in your belly. Gas will be put into your belly through one of the cuts. This will make it easier for your surgeon to see inside your belly during the surgery. A laparoscope will be put into one of the cuts in your belly. This will send pictures to a screen in the operating room. The instruments needed for the surgery will be put through the other cuts. The tissue that make up the hernia may be removed or moved back into place. The edges of the hernia may be stitched together. A piece of mesh may be used to close the hernia. Stitches or clips will be used to keep the mesh in place. The instruments and laparoscope will be removed. Your cuts will be closed with stitches, skin glue, or tape strips. A bandage may be placed over your cuts. These steps may vary. Ask what you can expect. What happens after? You'll be watched closely until you leave. This includes checking your pain level, blood pressure, heart rate, and breathing rate. You'll continue to receive fluids and medicines through an IV. Your IV will be removed when you can drink clear fluids. This information is not intended to replace advice given to you by your health care provider. Make sure you discuss any questions you have with your health care provider.

## 2024-10-12 ENCOUNTER — Telehealth: Payer: Self-pay | Admitting: General Surgery

## 2024-10-12 NOTE — Telephone Encounter (Signed)
 Patient has been advised of Pre-Admission date/time, and Surgery date at [facility].  Surgery Date: 10/24/24 Preadmission Testing Date: Preadmissions to call patient.    Patient informed of the scheduling process and surgery information given at time of office visit  Patient has been made aware to call [phone], between 1-3:00pm the day before surgery, to find out what time to arrive for surgery.

## 2024-10-17 ENCOUNTER — Other Ambulatory Visit: Payer: Self-pay

## 2024-10-17 ENCOUNTER — Inpatient Hospital Stay: Admission: RE | Admit: 2024-10-17 | Discharge: 2024-10-17 | Attending: General Surgery

## 2024-10-17 DIAGNOSIS — Z01810 Encounter for preprocedural cardiovascular examination: Secondary | ICD-10-CM

## 2024-10-17 DIAGNOSIS — K4020 Bilateral inguinal hernia, without obstruction or gangrene, not specified as recurrent: Secondary | ICD-10-CM

## 2024-10-17 HISTORY — DX: Malignant (primary) neoplasm, unspecified: C80.1

## 2024-10-17 HISTORY — DX: Bilateral inguinal hernia, without obstruction or gangrene, not specified as recurrent: K40.20

## 2024-10-17 NOTE — Patient Instructions (Addendum)
 Your procedure is scheduled on: 10/24/24 - Wednesday Report to the Registration Desk on the 1st floor of the [facility]. To find out your arrival time, please call [phone] between 1PM - 3PM on: 10/23/24 - Tuesday If your arrival time is 6:00 am, do not arrive before that time as the [facility] Mall entrance doors do not open until 6:00 am.  REMEMBER: Instructions that are not followed completely may result in serious medical risk, up to and including death; or upon the discretion of your surgeon and anesthesiologist your surgery may need to be rescheduled.  Do not eat food after midnight the night before surgery.  No gum chewing or hard candies.  You may however, drink CLEAR liquids up to 2 hours before you are scheduled to arrive for your surgery. Do not drink anything within 2 hours of your scheduled arrival time.  Clear liquids include: - water  - apple juice without pulp - gatorade (not RED colors) - black coffee or tea (Do NOT add milk or creamers to the coffee or tea) Do NOT drink anything that is not on this list.  One week prior to surgery: Stop Anti-inflammatories (NSAIDS) such as Advil, Aleve, Ibuprofen, Motrin, Naproxen, Naprosyn and Aspirin based products such as Excedrin, Goody's Powder, BC Powder. You may continue to take Tylenol  if needed for pain up until the day of surgery.  Stop ANY OVER THE COUNTER supplements until after surgery - may continue Vitamin B12  HOLD Aspirin beginning today , 10/17/24.  sildenafil  (VIAGRA ) hold 2 days prior to surgery beginning 02/09.  tadalafil  (CIALIS ) hold beginning 02/09, may resume after surgery  ON THE DAY OF SURGERY ONLY TAKE THESE MEDICATIONS WITH SIPS OF WATER:  none   No Alcohol for 24 hours before or after surgery.  No Smoking including e-cigarettes for 24 hours before surgery.  No chewable tobacco products for at least 6 hours before surgery.  No nicotine patches on the day of surgery.  Do not use any  recreational drugs for at least a week (preferably 2 weeks) before your surgery.  Please be advised that the combination of cocaine and anesthesia may have negative outcomes, up to and including death. If you test positive for cocaine, your surgery will be cancelled.  On the morning of surgery brush your teeth with toothpaste and water, you may rinse your mouth with mouthwash if you wish. Do not swallow any toothpaste or mouthwash.  Shower with Antibacterial Dial soap on the night before and morning of your surgery.  Do not wear jewelry, make-up, hairpins, clips or nail polish.  For welded (permanent) jewelry: bracelets, anklets, waist bands, etc.  Please have this removed prior to surgery.  If it is not removed, there is a chance that hospital personnel will need to cut it off on the day of surgery.  Do not wear lotions, powders, or perfumes.   Do not shave body hair from the neck down 48 hours before surgery.  Contact lenses, hearing aids and dentures may not be worn into surgery.  Do not bring valuables to the hospital. [facility] is not responsible for any missing/lost belongings or valuables.   Notify your doctor if there is any change in your medical condition (cold, fever, infection).  Wear comfortable clothing (specific to your surgery type) to the hospital.  After surgery, you can help prevent lung complications by doing breathing exercises.  Take deep breaths and cough every 1-2 hours. Your doctor may order a device called an Incentive  Spirometer to help you take deep breaths.  If you are being admitted to the hospital overnight, leave your suitcase in the car. After surgery it may be brought to your room.  In case of increased patient census, it may be necessary for you, the patient, to continue your postoperative care in the Same Day Surgery department.  If you are being discharged the day of surgery, you will not be allowed to drive home. You will need a responsible  individual to drive you home and stay with you for 24 hours after surgery.   If you are taking public transportation, you will need to have a responsible individual with you.  Please call the Pre-admissions Testing Dept. at [phone] if you have any questions about these instructions.  Surgery Visitation Policy:  Patients having surgery or a procedure may have two visitors.  Children under the age of 2 must have an adult with them who is not the patient.  Inpatient Visitation:    Visiting hours are 7 a.m. to 8 p.m. Up to four visitors are allowed at one time in a patient room. The visitors may rotate out with other people during the day.  One visitor age 66 or older may stay with the patient overnight and must be in the room by 8 p.m.   Merchandiser, Retail to address health-related social needs:  [email or web address]                                                                                                             Preparing for Surgery with CHLORHEXIDINE GLUCONATE (CHG) Soap  Chlorhexidine Gluconate (CHG) Soap  o An antiseptic cleaner that kills germs and bonds with the skin to continue killing germs even after washing  o Used for showering the night before surgery and morning of surgery  Before surgery, you can play an important role by reducing the number of germs on your skin.  CHG (Chlorhexidine gluconate) soap is an antiseptic cleanser which kills germs and bonds with the skin to continue killing germs even after washing.  Please do not use if you have an allergy to CHG or antibacterial soaps. If your skin becomes reddened/irritated stop using the CHG.  1. Shower the NIGHT BEFORE SURGERY with CHG soap.  2. If you choose to wash your hair, wash your hair first as usual with your normal shampoo.  3. After shampooing, rinse your hair and body thoroughly to remove the shampoo.  4. Use CHG as you would any other liquid soap. You can apply  CHG directly to the skin and wash gently with a clean washcloth.  5. Apply the CHG soap to your body only from the neck down. Do not use on open wounds or open sores. Avoid contact with your eyes, ears, mouth, and genitals (private parts). Wash face and genitals (private parts) with your normal soap.  6. Wash thoroughly, paying special attention to the area where your surgery will be performed.  7. Thoroughly rinse your body with warm water.  8. Do not  shower/wash with your normal soap after using and rinsing off the CHG soap.  9. Do not use lotions, oils, etc., after showering with CHG.  10. Pat yourself dry with a clean towel.  11. Wear clean pajamas to bed the night before surgery.  12. Place clean sheets on your bed the night of your shower and do not sleep with pets.  13. Do not apply any deodorants/lotions/powders.  14. Please wear clean clothes to the hospital.  15. Remember to brush your teeth with your regular toothpaste.

## 2024-10-18 ENCOUNTER — Inpatient Hospital Stay: Admission: RE | Admit: 2024-10-18 | Discharge: 2024-10-18 | Attending: General Surgery

## 2024-10-18 DIAGNOSIS — Z01810 Encounter for preprocedural cardiovascular examination: Secondary | ICD-10-CM

## 2024-10-24 ENCOUNTER — Encounter: Payer: Self-pay | Admitting: Urgent Care

## 2024-10-24 ENCOUNTER — Encounter: Admission: RE | Payer: Self-pay | Source: Home / Self Care

## 2024-10-24 ENCOUNTER — Ambulatory Visit: Admission: RE | Admit: 2024-10-24 | Admitting: General Surgery

## 2025-09-09 ENCOUNTER — Encounter: Admitting: Family Medicine
